# Patient Record
Sex: Male | Born: 1945 | Race: White | Hispanic: No | Marital: Married | Smoking: Never smoker
Health system: Southern US, Community
[De-identification: ages and names within clinical notes are randomized; demographics above are authoritative.]

## PROBLEM LIST (undated history)

## (undated) DIAGNOSIS — E119 Type 2 diabetes mellitus without complications: Secondary | ICD-10-CM

## (undated) DIAGNOSIS — Z8603 Personal history of neoplasm of uncertain behavior: Secondary | ICD-10-CM

## (undated) DIAGNOSIS — I872 Venous insufficiency (chronic) (peripheral): Secondary | ICD-10-CM

## (undated) DIAGNOSIS — I890 Lymphedema, not elsewhere classified: Secondary | ICD-10-CM

## (undated) HISTORY — DX: Lymphedema, not elsewhere classified: I89.0

## (undated) HISTORY — DX: Personal history of neoplasm of uncertain behavior: Z86.03

## (undated) HISTORY — DX: Venous insufficiency (chronic) (peripheral): I87.2

---

## 1999-06-14 ENCOUNTER — Encounter (HOSPITAL_BASED_OUTPATIENT_CLINIC_OR_DEPARTMENT_OTHER): Payer: Self-pay | Admitting: Internal Medicine

## 1999-06-14 ENCOUNTER — Encounter: Admission: RE | Admit: 1999-06-14 | Discharge: 1999-09-13 | Payer: Self-pay | Admitting: Internal Medicine

## 1999-09-13 ENCOUNTER — Encounter: Admission: RE | Admit: 1999-09-13 | Discharge: 1999-12-12 | Payer: Self-pay | Admitting: Internal Medicine

## 1999-11-15 ENCOUNTER — Encounter: Payer: Self-pay | Admitting: Family Medicine

## 1999-11-15 ENCOUNTER — Encounter: Admission: RE | Admit: 1999-11-15 | Discharge: 1999-11-15 | Payer: Self-pay | Admitting: Family Medicine

## 2000-03-27 ENCOUNTER — Ambulatory Visit (HOSPITAL_BASED_OUTPATIENT_CLINIC_OR_DEPARTMENT_OTHER): Admission: RE | Admit: 2000-03-27 | Discharge: 2000-03-28 | Payer: Self-pay | Admitting: Orthopedic Surgery

## 2001-07-03 ENCOUNTER — Encounter: Payer: Self-pay | Admitting: Ophthalmology

## 2001-07-03 ENCOUNTER — Ambulatory Visit (HOSPITAL_COMMUNITY): Admission: RE | Admit: 2001-07-03 | Discharge: 2001-07-04 | Payer: Self-pay | Admitting: Ophthalmology

## 2002-05-26 ENCOUNTER — Ambulatory Visit (HOSPITAL_COMMUNITY): Admission: RE | Admit: 2002-05-26 | Discharge: 2002-05-27 | Payer: Self-pay | Admitting: Ophthalmology

## 2002-09-24 ENCOUNTER — Ambulatory Visit (HOSPITAL_COMMUNITY): Admission: RE | Admit: 2002-09-24 | Discharge: 2002-09-24 | Payer: Self-pay

## 2002-10-29 ENCOUNTER — Ambulatory Visit (HOSPITAL_COMMUNITY): Admission: RE | Admit: 2002-10-29 | Discharge: 2002-10-29 | Payer: Self-pay

## 2014-10-13 ENCOUNTER — Inpatient Hospital Stay (HOSPITAL_COMMUNITY)
Admission: EM | Admit: 2014-10-13 | Discharge: 2014-10-18 | DRG: 562 | Disposition: A | Discharge status: Skilled Nursing Facility | Payer: Medicare Other | Attending: Internal Medicine | Admitting: Internal Medicine

## 2014-10-13 ENCOUNTER — Emergency Department (HOSPITAL_COMMUNITY): Payer: Medicare Other

## 2014-10-13 ENCOUNTER — Encounter (HOSPITAL_COMMUNITY): Payer: Self-pay

## 2014-10-13 DIAGNOSIS — D509 Iron deficiency anemia, unspecified: Secondary | ICD-10-CM | POA: Diagnosis present

## 2014-10-13 DIAGNOSIS — E875 Hyperkalemia: Secondary | ICD-10-CM

## 2014-10-13 DIAGNOSIS — I739 Peripheral vascular disease, unspecified: Secondary | ICD-10-CM | POA: Diagnosis present

## 2014-10-13 DIAGNOSIS — K219 Gastro-esophageal reflux disease without esophagitis: Secondary | ICD-10-CM | POA: Diagnosis present

## 2014-10-13 DIAGNOSIS — S82192A Other fracture of upper end of left tibia, initial encounter for closed fracture: Principal | ICD-10-CM | POA: Diagnosis present

## 2014-10-13 DIAGNOSIS — R339 Retention of urine, unspecified: Secondary | ICD-10-CM | POA: Diagnosis not present

## 2014-10-13 DIAGNOSIS — N179 Acute kidney failure, unspecified: Secondary | ICD-10-CM

## 2014-10-13 DIAGNOSIS — R0902 Hypoxemia: Secondary | ICD-10-CM

## 2014-10-13 DIAGNOSIS — E1122 Type 2 diabetes mellitus with diabetic chronic kidney disease: Secondary | ICD-10-CM

## 2014-10-13 DIAGNOSIS — N289 Disorder of kidney and ureter, unspecified: Secondary | ICD-10-CM

## 2014-10-13 DIAGNOSIS — Z7901 Long term (current) use of anticoagulants: Secondary | ICD-10-CM

## 2014-10-13 DIAGNOSIS — D638 Anemia in other chronic diseases classified elsewhere: Secondary | ICD-10-CM | POA: Diagnosis present

## 2014-10-13 DIAGNOSIS — E785 Hyperlipidemia, unspecified: Secondary | ICD-10-CM | POA: Diagnosis present

## 2014-10-13 DIAGNOSIS — Z6841 Body Mass Index (BMI) 40.0 and over, adult: Secondary | ICD-10-CM

## 2014-10-13 DIAGNOSIS — J9601 Acute respiratory failure with hypoxia: Secondary | ICD-10-CM | POA: Diagnosis present

## 2014-10-13 DIAGNOSIS — M8580 Other specified disorders of bone density and structure, unspecified site: Secondary | ICD-10-CM | POA: Diagnosis present

## 2014-10-13 DIAGNOSIS — N170 Acute kidney failure with tubular necrosis: Secondary | ICD-10-CM | POA: Diagnosis present

## 2014-10-13 DIAGNOSIS — E872 Acidosis: Secondary | ICD-10-CM | POA: Diagnosis present

## 2014-10-13 DIAGNOSIS — Z8673 Personal history of transient ischemic attack (TIA), and cerebral infarction without residual deficits: Secondary | ICD-10-CM

## 2014-10-13 DIAGNOSIS — I872 Venous insufficiency (chronic) (peripheral): Secondary | ICD-10-CM | POA: Diagnosis present

## 2014-10-13 DIAGNOSIS — I129 Hypertensive chronic kidney disease with stage 1 through stage 4 chronic kidney disease, or unspecified chronic kidney disease: Secondary | ICD-10-CM | POA: Diagnosis present

## 2014-10-13 DIAGNOSIS — I890 Lymphedema, not elsewhere classified: Secondary | ICD-10-CM | POA: Diagnosis present

## 2014-10-13 DIAGNOSIS — M25562 Pain in left knee: Secondary | ICD-10-CM

## 2014-10-13 DIAGNOSIS — W109XXA Fall (on) (from) unspecified stairs and steps, initial encounter: Secondary | ICD-10-CM | POA: Diagnosis present

## 2014-10-13 DIAGNOSIS — G4733 Obstructive sleep apnea (adult) (pediatric): Secondary | ICD-10-CM | POA: Diagnosis present

## 2014-10-13 DIAGNOSIS — E1165 Type 2 diabetes mellitus with hyperglycemia: Secondary | ICD-10-CM | POA: Diagnosis present

## 2014-10-13 DIAGNOSIS — R52 Pain, unspecified: Secondary | ICD-10-CM

## 2014-10-13 DIAGNOSIS — M25462 Effusion, left knee: Secondary | ICD-10-CM | POA: Diagnosis present

## 2014-10-13 DIAGNOSIS — E11649 Type 2 diabetes mellitus with hypoglycemia without coma: Secondary | ICD-10-CM | POA: Diagnosis not present

## 2014-10-13 DIAGNOSIS — N183 Chronic kidney disease, stage 3 (moderate): Secondary | ICD-10-CM | POA: Diagnosis present

## 2014-10-13 HISTORY — DX: Type 2 diabetes mellitus without complications: E11.9

## 2014-10-13 HISTORY — DX: Morbid (severe) obesity due to excess calories: E66.01

## 2014-10-13 LAB — CBG MONITORING, ED: Glucose-Capillary: 103 mg/dL — ABNORMAL HIGH (ref 65–99)

## 2014-10-13 NOTE — ED Notes (Signed)
Note:  He is unable to recall his medical history/meds and wishes to wait until his wife arrives, whom he states will be able to give [place] this information.

## 2014-10-13 NOTE — ED Notes (Signed)
Pt to xray

## 2014-10-13 NOTE — ED Notes (Signed)
While sleeping pt's O2 sat dropped to 88%. He was placed on 2L of Oxygen while sleeping for this

## 2014-10-13 NOTE — ED Provider Notes (Signed)
CSN: [id number]     Arrival date & time 10/13/14  1833 History   First MD Initiated Contact with Patient 10/13/14 2231     Chief Complaint  Patient presents with  . Fall   HPI  69 year old morbidly obese male presents today with left knee pain. Patient reports he was going up steps yesterday when he had his right knee flexed, reports pain to the right knee with a popping sound in his knee. Patient denies any fall, reports EMS came to the house and helped him into his bed. He notes since the incident he has been unable to ambulate due to left knee pain. Patient denies loss of sensation or perfusion to the distal extremity. Patient reports he takes oral tramadol as needed for back pain, reports this is only minorly improved his symptoms.  Past Medical History  Diagnosis Date  . Morbid obesity    No past surgical history on file. No family history on file. Social History  Substance Use Topics  . Smoking status: Never Smoker   . Smokeless tobacco: None  . Alcohol Use: No    Review of Systems  All other systems reviewed and are negative.   Allergies  Codeine and Shellfish allergy  Home Medications   Prior to Admission medications   Medication Sig Start Date End Date Taking? Authorizing Provider  allopurinol (ZYLOPRIM) 300 MG tablet Take 300 mg by mouth daily.   Yes Historical Provider, MD  amLODipine (NORVASC) 10 MG tablet Take 10 mg by mouth daily.   Yes Historical Provider, MD  atorvastatin (LIPITOR) 20 MG tablet Take 20 mg by mouth at bedtime.   Yes Historical Provider, MD  cloNIDine (CATAPRES) 0.2 MG tablet Take 0.2 mg by mouth 3 (three) times daily.   Yes Historical Provider, MD  diphenhydrAMINE (BENADRYL) 25 MG tablet Take 25 mg by mouth every 6 (six) hours as needed for itching.   Yes Historical Provider, MD  docusate sodium (COLACE) 100 MG capsule Take 100 mg by mouth 2 (two) times daily.   Yes Historical Provider, MD  ferrous sulfate 325 (65 FE) MG tablet Take 325 mg by  mouth daily with breakfast.   Yes Historical Provider, MD  gabapentin (NEURONTIN) 100 MG capsule Take 100 mg by mouth 2 (two) times daily.   Yes Historical Provider, MD  hydrOXYzine (ATARAX/VISTARIL) 25 MG tablet Take 25 mg by mouth 2 (two) times daily.   Yes Historical Provider, MD  insulin NPH Human (HUMULIN N,NOVOLIN N) 100 UNIT/ML injection Inject 60-70 Units into the skin 2 (two) times daily before a meal. Take 70 units every morning and Take 60 units every evening.   Yes Historical Provider, MD  insulin regular (NOVOLIN R,HUMULIN R) 100 units/mL injection Inject 3 Units into the skin 3 (three) times daily as needed for high blood sugar.   Yes Historical Provider, MD  levocetirizine (XYZAL) 5 MG tablet Take 5 mg by mouth every evening.   Yes Historical Provider, MD  lisinopril (PRINIVIL,ZESTRIL) 40 MG tablet Take 40 mg by mouth 2 (two) times daily.   Yes Historical Provider, MD  Nystatin (NYAMYC) 100000 UNIT/GM POWD Apply 1 g topically 2 (two) times daily.  10/05/14  Yes Historical Provider, MD  omeprazole (PRILOSEC) 20 MG capsule Take 20 mg by mouth daily.   Yes Historical Provider, MD  pramipexole (MIRAPEX) 1 MG tablet Take 1 mg by mouth at bedtime.   Yes Historical Provider, MD  Probiotic CAPS Take 1 capsule by mouth daily.   Yes  Historical Provider, MD  SSD 1 % cream Apply 1 application topically daily as needed (sores).  09/20/14  Yes Historical Provider, MD  tamsulosin (FLOMAX) 0.4 MG CAPS capsule Take 0.4 mg by mouth daily.   Yes Historical Provider, MD  tiZANidine (ZANAFLEX) 2 MG tablet Take 2 mg by mouth 2 (two) times daily.   Yes Historical Provider, MD  traMADol (ULTRAM) 50 MG tablet Take 50 mg by mouth every 6 (six) hours as needed for moderate pain or severe pain.   Yes Historical Provider, MD  Triamcinolone Acetonide (TRIAMCINOLONE 0.1 % CREAM : EUCERIN) CREA Apply 1 application topically daily.   Yes Historical Provider, MD  warfarin (COUMADIN) 10 MG tablet Take 5 mg by mouth daily.    Yes Historical Provider, MD   BP 130/62 mmHg  Pulse 68  Temp(Src) 98.1 F (36.7 C) (Oral)  Resp 24  Ht 5\' 5"  (1.651 m)  Wt 415 lb (188.243 kg)  BMI 69.06 kg/m2  SpO2 92%   Physical Exam  Constitutional: He is oriented to person, place, and time. He appears well-developed and well-nourished.  Morbidly obese  HENT:  Head: Normocephalic and atraumatic.  Eyes: Conjunctivae are normal. Pupils are equal, round, and reactive to light. Right eye exhibits no discharge. Left eye exhibits no discharge. No scleral icterus.  Neck: Normal range of motion. No JVD present. No tracheal deviation present.  Pulmonary/Chest: Effort normal. No stridor.  Musculoskeletal:  TTP of right knee and hip. Difficult exam due to body habitus. Pain with flexion. Distal extremities wrapped due to cellultis  Neurological: He is alert and oriented to person, place, and time. Coordination normal.  Psychiatric: He has a normal mood and affect. His behavior is normal. Judgment and thought content normal.  Nursing note and vitals reviewed.   ED Course  Procedures (including critical care time) Labs Review Labs Reviewed  CBG MONITORING, ED - Abnormal; Notable for the following:    Glucose-Capillary 103 (*)    All other components within normal limits    Imaging Review No results found. I have personally reviewed and evaluated these images and lab results as part of my medical decision-making.   EKG Interpretation None      MDM   Final diagnoses:  Pain  Renal insufficiency  Hyperkalemia  Knee pain, acute, left    Labs: CBC, BMP  Imaging: DG knee, Hip, femur, pending  Consults:  Therapeutics:  Discharge Meds:   Assessment/Plan: Patient presents with left knee pain, initial DG knee films showed no joint effusion, potential fracture. Patient was signed off to mid level Junius Creamer NP at time of shift change pending remaining films, labs. Patient was in no acute distress at the time of my  evaluation.        Okey Regal, PA-C 10/14/14 1423  Rolland Porter, MD 11/02/14 [phone]

## 2014-10-13 NOTE — ED Notes (Signed)
Bed: BM18 Expected date:  Expected time:  Means of arrival:  Comments: Ems- bariatric knee pain

## 2014-10-13 NOTE — Progress Notes (Addendum)
EDCM spoke to patient's wife at bedside, patient asleep.  Patient's wife is Doctor, hospital.  Patient lives with his wife at home.  Patient's wife confirms patient's pcp is Dr. Ella Jubilee.  Patient's wife confirms patient is usually able to perform his own ADL's at home.  Patient's wife reports patient's knee is sore and swollen now, and unable to put weight on it.  Patient's wife reports patient has a power chair at home, bedside commode, a hospital bed and 2 walkers.  Offered support to patient's wife.  EDCM provided patient's wife list of home health services in [facility], explained services. Patient's wife reports patient is already receiving home health services with [facility] for PT and OT. Patient's wife thankful for resources.  No further EDCM needs at this time.

## 2014-10-13 NOTE — Progress Notes (Signed)
CSW met with patient at bedside. Patient appeared to be sleepy. There was no family present.   Per chart, patient presents to [facility] due to bariatric knee pain. Patient states that while going up the steps he was not able to pull his R leg up. Patient informed CSW that EMS came to the scene and attempted to straighten his leg out. Patient states that his leg popped x3.  Patient informed CSW that he lives in [place] with is wife. Patient states that his wife is his primary support and she assist him with completing his ADL's. Patient denies falling often.  Patient states that he does not have any questions for CSW at this time.   Willette Brace [phone] ED CSW 10/13/2014 8:25 PM

## 2014-10-13 NOTE — ED Notes (Signed)
He states that yesterday his left knee "gave out" and is still sore today.  He is in no distress.  He is morbidly obese.  He denies any other injury or discomfort.

## 2014-10-14 ENCOUNTER — Inpatient Hospital Stay (HOSPITAL_COMMUNITY): Payer: Medicare Other

## 2014-10-14 ENCOUNTER — Emergency Department (HOSPITAL_COMMUNITY): Payer: Medicare Other

## 2014-10-14 ENCOUNTER — Encounter (HOSPITAL_COMMUNITY): Payer: Self-pay

## 2014-10-14 DIAGNOSIS — R339 Retention of urine, unspecified: Secondary | ICD-10-CM | POA: Diagnosis not present

## 2014-10-14 DIAGNOSIS — M25569 Pain in unspecified knee: Secondary | ICD-10-CM | POA: Insufficient documentation

## 2014-10-14 DIAGNOSIS — M25562 Pain in left knee: Secondary | ICD-10-CM | POA: Diagnosis present

## 2014-10-14 DIAGNOSIS — Z8673 Personal history of transient ischemic attack (TIA), and cerebral infarction without residual deficits: Secondary | ICD-10-CM | POA: Diagnosis not present

## 2014-10-14 DIAGNOSIS — E11649 Type 2 diabetes mellitus with hypoglycemia without coma: Secondary | ICD-10-CM | POA: Diagnosis not present

## 2014-10-14 DIAGNOSIS — I129 Hypertensive chronic kidney disease with stage 1 through stage 4 chronic kidney disease, or unspecified chronic kidney disease: Secondary | ICD-10-CM | POA: Diagnosis present

## 2014-10-14 DIAGNOSIS — Z6841 Body Mass Index (BMI) 40.0 and over, adult: Secondary | ICD-10-CM | POA: Diagnosis not present

## 2014-10-14 DIAGNOSIS — N170 Acute kidney failure with tubular necrosis: Secondary | ICD-10-CM | POA: Diagnosis present

## 2014-10-14 DIAGNOSIS — S82192A Other fracture of upper end of left tibia, initial encounter for closed fracture: Secondary | ICD-10-CM | POA: Diagnosis present

## 2014-10-14 DIAGNOSIS — E875 Hyperkalemia: Secondary | ICD-10-CM | POA: Insufficient documentation

## 2014-10-14 DIAGNOSIS — M8580 Other specified disorders of bone density and structure, unspecified site: Secondary | ICD-10-CM | POA: Diagnosis present

## 2014-10-14 DIAGNOSIS — E1122 Type 2 diabetes mellitus with diabetic chronic kidney disease: Secondary | ICD-10-CM | POA: Diagnosis present

## 2014-10-14 DIAGNOSIS — E785 Hyperlipidemia, unspecified: Secondary | ICD-10-CM | POA: Diagnosis present

## 2014-10-14 DIAGNOSIS — I872 Venous insufficiency (chronic) (peripheral): Secondary | ICD-10-CM | POA: Diagnosis present

## 2014-10-14 DIAGNOSIS — K219 Gastro-esophageal reflux disease without esophagitis: Secondary | ICD-10-CM | POA: Diagnosis present

## 2014-10-14 DIAGNOSIS — N179 Acute kidney failure, unspecified: Secondary | ICD-10-CM | POA: Diagnosis not present

## 2014-10-14 DIAGNOSIS — D638 Anemia in other chronic diseases classified elsewhere: Secondary | ICD-10-CM | POA: Diagnosis present

## 2014-10-14 DIAGNOSIS — G4733 Obstructive sleep apnea (adult) (pediatric): Secondary | ICD-10-CM | POA: Diagnosis not present

## 2014-10-14 DIAGNOSIS — S82202D Unspecified fracture of shaft of left tibia, subsequent encounter for closed fracture with routine healing: Secondary | ICD-10-CM | POA: Diagnosis not present

## 2014-10-14 DIAGNOSIS — I10 Essential (primary) hypertension: Secondary | ICD-10-CM | POA: Diagnosis not present

## 2014-10-14 DIAGNOSIS — N183 Chronic kidney disease, stage 3 (moderate): Secondary | ICD-10-CM | POA: Diagnosis not present

## 2014-10-14 DIAGNOSIS — N289 Disorder of kidney and ureter, unspecified: Secondary | ICD-10-CM

## 2014-10-14 DIAGNOSIS — J9601 Acute respiratory failure with hypoxia: Secondary | ICD-10-CM | POA: Diagnosis not present

## 2014-10-14 DIAGNOSIS — I890 Lymphedema, not elsewhere classified: Secondary | ICD-10-CM | POA: Diagnosis present

## 2014-10-14 DIAGNOSIS — M25462 Effusion, left knee: Secondary | ICD-10-CM | POA: Diagnosis present

## 2014-10-14 DIAGNOSIS — E1165 Type 2 diabetes mellitus with hyperglycemia: Secondary | ICD-10-CM | POA: Diagnosis present

## 2014-10-14 DIAGNOSIS — E872 Acidosis: Secondary | ICD-10-CM | POA: Diagnosis present

## 2014-10-14 DIAGNOSIS — I739 Peripheral vascular disease, unspecified: Secondary | ICD-10-CM | POA: Diagnosis present

## 2014-10-14 DIAGNOSIS — Z7901 Long term (current) use of anticoagulants: Secondary | ICD-10-CM | POA: Diagnosis not present

## 2014-10-14 DIAGNOSIS — D509 Iron deficiency anemia, unspecified: Secondary | ICD-10-CM | POA: Diagnosis present

## 2014-10-14 DIAGNOSIS — W109XXA Fall (on) (from) unspecified stairs and steps, initial encounter: Secondary | ICD-10-CM | POA: Diagnosis present

## 2014-10-14 LAB — BASIC METABOLIC PANEL
Anion gap: 8 (ref 5–15)
Anion gap: 8 (ref 5–15)
BUN: 45 mg/dL — ABNORMAL HIGH (ref 6–20)
BUN: 40 mg/dL — ABNORMAL HIGH (ref 6–20)
CHLORIDE: 114 mmol/L — AB (ref 101–111)
CO2: 18 mmol/L — ABNORMAL LOW (ref 22–32)
CO2: 19 mmol/L — ABNORMAL LOW (ref 22–32)
Calcium: 8.4 mg/dL — ABNORMAL LOW (ref 8.9–10.3)
Calcium: 8.4 mg/dL — ABNORMAL LOW (ref 8.9–10.3)
Chloride: 113 mmol/L — ABNORMAL HIGH (ref 101–111)
Creatinine, Ser: 3.06 mg/dL — ABNORMAL HIGH (ref 0.61–1.24)
Creatinine, Ser: 2.89 mg/dL — ABNORMAL HIGH (ref 0.61–1.24)
GFR calc Af Amer: 22 mL/min — ABNORMAL LOW (ref >60)
GFR calc non Af Amer: 19 mL/min — ABNORMAL LOW (ref >60)
GFR calc non Af Amer: 20 mL/min — ABNORMAL LOW (ref >60)
GFR, EST AFRICAN AMERICAN: 24 mL/min — AB (ref >60)
Glucose, Bld: 92 mg/dL (ref 65–99)
Glucose, Bld: 135 mg/dL — ABNORMAL HIGH (ref 65–99)
POTASSIUM: 6 mmol/L — AB (ref 3.5–5.1)
Potassium: 5.9 mmol/L — ABNORMAL HIGH (ref 3.5–5.1)
SODIUM: 141 mmol/L (ref 135–145)
Sodium: 139 mmol/L (ref 135–145)

## 2014-10-14 LAB — CBC WITH DIFFERENTIAL/PLATELET
Basophils Absolute: 0 K/uL (ref 0.0–0.1)
Basophils Relative: 0 % (ref 0–1)
Eosinophils Absolute: 0.3 K/uL (ref 0.0–0.7)
Eosinophils Relative: 5 % (ref 0–5)
HCT: 29.7 % — ABNORMAL LOW (ref 39.0–52.0)
Hemoglobin: 9.2 g/dL — ABNORMAL LOW (ref 13.0–17.0)
Lymphocytes Relative: 17 % (ref 12–46)
Lymphs Abs: 1.1 K/uL (ref 0.7–4.0)
MCH: 27.1 pg (ref 26.0–34.0)
MCHC: 31 g/dL (ref 30.0–36.0)
MCV: 87.6 fL (ref 78.0–100.0)
Monocytes Absolute: 0.6 K/uL (ref 0.1–1.0)
Monocytes Relative: 9 % (ref 3–12)
Neutro Abs: 4.4 K/uL (ref 1.7–7.7)
Neutrophils Relative %: 69 % (ref 43–77)
Platelets: 181 K/uL (ref 150–400)
RBC: 3.39 MIL/uL — ABNORMAL LOW (ref 4.22–5.81)
RDW: 16.5 % — ABNORMAL HIGH (ref 11.5–15.5)
WBC: 6.4 K/uL (ref 4.0–10.5)

## 2014-10-14 LAB — URINALYSIS W MICROSCOPIC (NOT AT ARMC)
Bilirubin Urine: NEGATIVE
Glucose, UA: NEGATIVE mg/dL
Ketones, ur: NEGATIVE mg/dL
Leukocytes, UA: NEGATIVE
Nitrite: NEGATIVE
Protein, ur: 100 mg/dL — AB
Specific Gravity, Urine: 1.014 (ref 1.005–1.030)
Urobilinogen, UA: 0.2 mg/dL (ref 0.0–1.0)
pH: 5.5 (ref 5.0–8.0)

## 2014-10-14 LAB — I-STAT CHEM 8, ED
BUN: 40 mg/dL — AB (ref 6–20)
CREATININE: 2.9 mg/dL — AB (ref 0.61–1.24)
Calcium, Ion: 1.05 mmol/L — ABNORMAL LOW (ref 1.13–1.30)
Chloride: 114 mmol/L — ABNORMAL HIGH (ref 101–111)
GLUCOSE: 91 mg/dL (ref 65–99)
HCT: 28 % — ABNORMAL LOW (ref 39.0–52.0)
HEMOGLOBIN: 9.5 g/dL — AB (ref 13.0–17.0)
POTASSIUM: 6.1 mmol/L — AB (ref 3.5–5.1)
Sodium: 140 mmol/L (ref 135–145)
TCO2: 17 mmol/L (ref 0–100)

## 2014-10-14 LAB — SODIUM, URINE, RANDOM: SODIUM UR: 133 mmol/L

## 2014-10-14 LAB — PROTIME-INR
INR: 2.29 — ABNORMAL HIGH (ref 0.00–1.49)
Prothrombin Time: 25 seconds — ABNORMAL HIGH (ref 11.6–15.2)

## 2014-10-14 LAB — GLUCOSE, CAPILLARY
GLUCOSE-CAPILLARY: 144 mg/dL — AB (ref 65–99)
GLUCOSE-CAPILLARY: 115 mg/dL — AB (ref 65–99)
GLUCOSE-CAPILLARY: 162 mg/dL — AB (ref 65–99)
Glucose-Capillary: 89 mg/dL (ref 65–99)

## 2014-10-14 LAB — TSH: TSH: 3.743 u[IU]/mL (ref 0.350–4.500)

## 2014-10-14 LAB — CREATININE, URINE, RANDOM: Creatinine, Urine: 43.16 mg/dL

## 2014-10-14 MED ORDER — HYDROXYZINE HCL 25 MG PO TABS
25.0000 mg | ORAL_TABLET | Freq: Two times a day (BID) | ORAL | Status: DC
  Administered 2014-10-14 – 2014-10-18 (×9): 25 mg via ORAL
  Filled 2014-10-14 (×9): qty 1

## 2014-10-14 MED ORDER — INSULIN NPH (HUMAN) (ISOPHANE) 100 UNIT/ML ~~LOC~~ SUSP
70.0000 [IU] | Freq: Every day | SUBCUTANEOUS | Status: DC
  Administered 2014-10-14 – 2014-10-15 (×2): 70 [IU] via SUBCUTANEOUS
  Filled 2014-10-14: qty 10

## 2014-10-14 MED ORDER — WARFARIN - PHARMACIST DOSING INPATIENT
Freq: Every day | Status: DC
  Administered 2014-10-18: 18:00:00

## 2014-10-14 MED ORDER — TAMSULOSIN HCL 0.4 MG PO CAPS
0.4000 mg | ORAL_CAPSULE | Freq: Every day | ORAL | Status: DC
  Administered 2014-10-14 – 2014-10-18 (×5): 0.4 mg via ORAL
  Filled 2014-10-14 (×5): qty 1

## 2014-10-14 MED ORDER — INSULIN ASPART 100 UNIT/ML ~~LOC~~ SOLN
0.0000 [IU] | Freq: Three times a day (TID) | SUBCUTANEOUS | Status: DC
  Administered 2014-10-15: 2 [IU] via SUBCUTANEOUS
  Administered 2014-10-15 – 2014-10-18 (×5): 1 [IU] via SUBCUTANEOUS

## 2014-10-14 MED ORDER — LEVOCETIRIZINE DIHYDROCHLORIDE 5 MG PO TABS: 5.0000 mg | ORAL_TABLET | Freq: Every evening | ORAL | Status: DC

## 2014-10-14 MED ORDER — TRAMADOL HCL 50 MG PO TABS
50.0000 mg | ORAL_TABLET | Freq: Four times a day (QID) | ORAL | Status: DC | PRN
  Administered 2014-10-14 (×2): 50 mg via ORAL
  Filled 2014-10-14 (×2): qty 1

## 2014-10-14 MED ORDER — WARFARIN SODIUM 5 MG PO TABS: 5.0000 mg | ORAL_TABLET | Freq: Every day | ORAL | Status: DC

## 2014-10-14 MED ORDER — ATORVASTATIN CALCIUM 10 MG PO TABS
20.0000 mg | ORAL_TABLET | Freq: Every day | ORAL | Status: DC
  Administered 2014-10-14 – 2014-10-17 (×4): 20 mg via ORAL
  Filled 2014-10-14 (×4): qty 2

## 2014-10-14 MED ORDER — SODIUM CHLORIDE 0.9 % IJ SOLN
3.0000 mL | Freq: Two times a day (BID) | INTRAMUSCULAR | Status: DC
  Administered 2014-10-16 – 2014-10-18 (×4): 3 mL via INTRAVENOUS

## 2014-10-14 MED ORDER — ALLOPURINOL 300 MG PO TABS
300.0000 mg | ORAL_TABLET | Freq: Every day | ORAL | Status: DC
  Administered 2014-10-14 – 2014-10-18 (×5): 300 mg via ORAL
  Filled 2014-10-14 (×5): qty 1

## 2014-10-14 MED ORDER — LORATADINE 10 MG PO TABS
10.0000 mg | ORAL_TABLET | Freq: Every day | ORAL | Status: DC
  Administered 2014-10-14 – 2014-10-18 (×5): 10 mg via ORAL
  Filled 2014-10-14 (×5): qty 1

## 2014-10-14 MED ORDER — DIPHENHYDRAMINE HCL 25 MG PO CAPS
25.0000 mg | ORAL_CAPSULE | Freq: Four times a day (QID) | ORAL | Status: DC | PRN
  Administered 2014-10-17 – 2014-10-18 (×3): 25 mg via ORAL
  Filled 2014-10-14 (×3): qty 1

## 2014-10-14 MED ORDER — SODIUM CHLORIDE 0.9 % IV BOLUS (SEPSIS)
500.0000 mL | Freq: Once | INTRAVENOUS | Status: AC
  Administered 2014-10-14: 500 mL via INTRAVENOUS

## 2014-10-14 MED ORDER — ONDANSETRON HCL 4 MG/2ML IJ SOLN: 4.0000 mg | Freq: Four times a day (QID) | INTRAMUSCULAR | Status: DC | PRN

## 2014-10-14 MED ORDER — INSULIN NPH (HUMAN) (ISOPHANE) 100 UNIT/ML ~~LOC~~ SUSP
60.0000 [IU] | Freq: Every day | SUBCUTANEOUS | Status: DC
  Administered 2014-10-14 – 2014-10-15 (×2): 60 [IU] via SUBCUTANEOUS

## 2014-10-14 MED ORDER — FERROUS SULFATE 325 (65 FE) MG PO TABS
325.0000 mg | ORAL_TABLET | Freq: Every day | ORAL | Status: DC
  Administered 2014-10-14 – 2014-10-18 (×5): 325 mg via ORAL
  Filled 2014-10-14 (×5): qty 1

## 2014-10-14 MED ORDER — WARFARIN SODIUM 5 MG PO TABS
5.0000 mg | ORAL_TABLET | Freq: Once | ORAL | Status: AC
  Administered 2014-10-14: 5 mg via ORAL
  Filled 2014-10-14: qty 1

## 2014-10-14 MED ORDER — NYSTATIN 100000 UNIT/GM EX POWD
Freq: Two times a day (BID) | CUTANEOUS | Status: DC
  Administered 2014-10-14 – 2014-10-18 (×9): via TOPICAL
  Filled 2014-10-14: qty 15

## 2014-10-14 MED ORDER — GABAPENTIN 100 MG PO CAPS
100.0000 mg | ORAL_CAPSULE | Freq: Two times a day (BID) | ORAL | Status: DC
  Administered 2014-10-14 – 2014-10-18 (×9): 100 mg via ORAL
  Filled 2014-10-14 (×9): qty 1

## 2014-10-14 MED ORDER — TRAMADOL HCL 50 MG PO TABS
50.0000 mg | ORAL_TABLET | Freq: Four times a day (QID) | ORAL | Status: DC | PRN
  Administered 2014-10-15: 50 mg via ORAL
  Filled 2014-10-14: qty 1

## 2014-10-14 MED ORDER — PRAMIPEXOLE DIHYDROCHLORIDE 1 MG PO TABS
1.0000 mg | ORAL_TABLET | Freq: Every day | ORAL | Status: DC
  Administered 2014-10-15 – 2014-10-17 (×4): 1 mg via ORAL
  Filled 2014-10-14 (×5): qty 1

## 2014-10-14 MED ORDER — AMLODIPINE BESYLATE 10 MG PO TABS
10.0000 mg | ORAL_TABLET | Freq: Every day | ORAL | Status: DC
  Administered 2014-10-14 – 2014-10-18 (×5): 10 mg via ORAL
  Filled 2014-10-14 (×5): qty 1

## 2014-10-14 MED ORDER — OXYCODONE-ACETAMINOPHEN 5-325 MG PO TABS
1.0000 | ORAL_TABLET | ORAL | Status: DC | PRN
  Administered 2014-10-15 (×2): 2 via ORAL
  Administered 2014-10-15: 1 via ORAL
  Filled 2014-10-14: qty 2
  Filled 2014-10-14: qty 1
  Filled 2014-10-14: qty 2

## 2014-10-14 MED ORDER — CLONIDINE HCL 0.1 MG PO TABS
0.2000 mg | ORAL_TABLET | Freq: Three times a day (TID) | ORAL | Status: DC
  Administered 2014-10-14 – 2014-10-18 (×14): 0.2 mg via ORAL
  Filled 2014-10-14 (×14): qty 2

## 2014-10-14 MED ORDER — OXYCODONE-ACETAMINOPHEN 5-325 MG PO TABS
2.0000 | ORAL_TABLET | Freq: Once | ORAL | Status: AC
  Administered 2014-10-14: 2 via ORAL
  Filled 2014-10-14: qty 2

## 2014-10-14 MED ORDER — PANTOPRAZOLE SODIUM 40 MG PO TBEC
40.0000 mg | DELAYED_RELEASE_TABLET | Freq: Every day | ORAL | Status: DC
  Administered 2014-10-14 – 2014-10-18 (×5): 40 mg via ORAL
  Filled 2014-10-14 (×5): qty 1

## 2014-10-14 MED ORDER — NYAMYC 100000 UNIT/GM EX POWD: 1.0000 g | Freq: Two times a day (BID) | CUTANEOUS | Status: DC

## 2014-10-14 MED ORDER — SODIUM CHLORIDE 0.9 % IV SOLN
Freq: Once | INTRAVENOUS | Status: AC
  Administered 2014-10-14: 05:00:00 via INTRAVENOUS

## 2014-10-14 MED ORDER — SODIUM POLYSTYRENE SULFONATE 15 GM/60ML PO SUSP
30.0000 g | Freq: Once | ORAL | Status: AC
  Administered 2014-10-14: 30 g via ORAL
  Filled 2014-10-14: qty 120

## 2014-10-14 MED ORDER — ONDANSETRON HCL 4 MG PO TABS: 4.0000 mg | ORAL_TABLET | Freq: Four times a day (QID) | ORAL | Status: DC | PRN

## 2014-10-14 MED ORDER — TIZANIDINE HCL 2 MG PO TABS
2.0000 mg | ORAL_TABLET | Freq: Two times a day (BID) | ORAL | Status: DC
  Administered 2014-10-14 – 2014-10-18 (×9): 2 mg via ORAL
  Filled 2014-10-14 (×11): qty 1

## 2014-10-14 MED ORDER — DOCUSATE SODIUM 100 MG PO CAPS
100.0000 mg | ORAL_CAPSULE | Freq: Two times a day (BID) | ORAL | Status: DC
  Administered 2014-10-14 – 2014-10-18 (×9): 100 mg via ORAL
  Filled 2014-10-14 (×10): qty 1

## 2014-10-14 MED ORDER — TRIAMCINOLONE 0.1 % CREAM:EUCERIN CREAM 1:1
1.0000 "application " | TOPICAL_CREAM | Freq: Every day | CUTANEOUS | Status: DC
  Administered 2014-10-14 – 2014-10-18 (×4): 1 via TOPICAL
  Filled 2014-10-14: qty 1

## 2014-10-14 MED ORDER — INSULIN ASPART 100 UNIT/ML ~~LOC~~ SOLN
0.0000 [IU] | SUBCUTANEOUS | Status: DC
  Administered 2014-10-14: 1 [IU] via SUBCUTANEOUS

## 2014-10-14 NOTE — ED Notes (Signed)
Patient transported to CT 

## 2014-10-14 NOTE — ED Provider Notes (Signed)
Pt was going up stairs today, and he had his left leg on the landing and his right leg on the step up, however, his right leg wouldn't support his weight to step up and he twisted his left knee. He has been unable to bear weight on his left leg since that time.   Pt is morbidly obese, with a large panus. He has some tenderness in the right hip area to palpation. His knee doesn't appear to have any deformity.    Medical screening examination/treatment/procedure(s) were conducted as a shared visit with non-physician practitioner(s) and myself.  I personally evaluated the patient during the encounter.   EKG Interpretation None       Rolland Porter, MD, Barbette Or, MD 10/14/14 [phone]

## 2014-10-14 NOTE — Progress Notes (Signed)
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 Codeine Itching  . Shellfish Allergy Nausea Only    Patient Measurements: Height: 5\' 5"  (165.1 cm) Weight: (!) 423 lb 15.1 oz (192.3 kg) IBW/kg (Calculated) : 61.5  Vital Signs: Temp: 98.3 F (36.8 C) (08/25 1311) Temp Source: Oral (08/25 1311) BP: 150/65 mmHg (08/25 1311) Pulse Rate: 87 (08/25 1311)  Labs:  Recent Labs  10/14/14 0237 10/14/14 0646 10/14/14 1210  HGB 9.2* 9.5*  --   HCT 29.7* 28.0*  --   PLT 181  --   --   LABPROT  --   --  25.0*  INR  --   --  2.29*  CREATININE 3.06* 2.90*  --     Estimated Creatinine Clearance: 37.6 mL/min (by C-G formula based on Cr of 2.9).   Medical History: Past Medical History  Diagnosis Date  . Morbid obesity   . Diabetes mellitus without complication     Medications:  Prescriptions prior to admission  Medication Sig Dispense Refill Last Dose  . allopurinol (ZYLOPRIM) 300 MG tablet Take 300 mg by mouth daily.   10/13/2014 at Unknown time  . amLODipine (NORVASC) 10 MG tablet Take 10 mg by mouth daily.   10/13/2014 at Unknown time  . atorvastatin (LIPITOR) 20 MG tablet Take 20 mg by mouth at bedtime.   10/12/2014 at Unknown time  . cloNIDine (CATAPRES) 0.2 MG tablet Take 0.2 mg by mouth 3 (three) times daily.   10/13/2014 at 1400  . diphenhydrAMINE (BENADRYL) 25 MG tablet Take 25 mg by mouth every 6 (six) hours as needed for itching.   10/12/2014 at Unknown time  . docusate sodium (COLACE) 100 MG capsule Take 100 mg by mouth 2 (two) times daily.   10/13/2014 at Unknown time  . ferrous sulfate 325 (65 FE) MG tablet Take 325 mg by mouth daily with breakfast.   10/13/2014 at Unknown time  . gabapentin (NEURONTIN) 100 MG capsule Take 100 mg by mouth 2 (two) times daily.   10/13/2014 at Unknown time  . hydrOXYzine (ATARAX/VISTARIL) 25 MG tablet Take 25 mg by mouth 2 (two) times daily.    10/13/2014 at Unknown time  . insulin NPH Human (HUMULIN N,NOVOLIN N) 100 UNIT/ML injection Inject 60-70 Units into the skin 2 (two) times daily before a meal. Take 70 units every morning and Take 60 units every evening.   10/13/2014 at Unknown time  . insulin regular (NOVOLIN R,HUMULIN R) 100 units/mL injection Inject 3 Units into the skin 3 (three) times daily as needed for high blood sugar.   10/13/2014 at Unknown time  . levocetirizine (XYZAL) 5 MG tablet Take 5 mg by mouth every evening.   10/12/2014 at Unknown time  . lisinopril (PRINIVIL,ZESTRIL) 40 MG tablet Take 40 mg by mouth 2 (two) times daily.   10/13/2014 at Unknown time  . Nystatin (NYAMYC) 100000 UNIT/GM POWD Apply 1 g topically 2 (two) times daily.   4 10/13/2014 at Unknown time  . omeprazole (PRILOSEC) 20 MG capsule Take 20 mg by mouth daily.   10/13/2014 at Unknown time  . pramipexole (MIRAPEX) 1 MG tablet Take 1 mg by mouth at bedtime.   10/12/2014 at Unknown time  . Probiotic CAPS Take 1 capsule by mouth daily.   10/13/2014 at Unknown time  . SSD 1 % cream Apply 1 application topically daily as needed (sores).   0 Past Month at  Unknown time  . tamsulosin (FLOMAX) 0.4 MG CAPS capsule Take 0.4 mg by mouth daily.   10/13/2014 at Unknown time  . tiZANidine (ZANAFLEX) 2 MG tablet Take 2 mg by mouth 2 (two) times daily.   10/13/2014 at Unknown time  . traMADol (ULTRAM) 50 MG tablet Take 50 mg by mouth every 6 (six) hours as needed for moderate pain or severe pain.   10/12/2014 at Unknown time  . Triamcinolone Acetonide (TRIAMCINOLONE 0.1 % CREAM : EUCERIN) CREA Apply 1 application topically daily.   10/13/2014 at Unknown time  . warfarin (COUMADIN) 10 MG tablet Take 5 mg by mouth daily.   10/12/2014 at 1800    Assessment: 69 yo obese M presented with knee pain, fracture.  He is on chronic Coumadin 5mg  daily.  Exact indication unclear-unable to arouse patient to discuss at this time. INR therapeutic on admission.  No bleeding noted.   Goal of  Therapy:  INR 2-3   Plan:  Coumadin 5mg  po x1 today Daily INR  Zarah Carbon, Lavonia Drafts 10/14/2014,1:47 PM

## 2014-10-14 NOTE — ED Notes (Signed)
0815 pt can go to floor.

## 2014-10-14 NOTE — ED Provider Notes (Signed)
Handoff at shift change to follow-up on labs and x-rays.  X-rays at been reviewed.  There is a questionable knee fracture.  Hip and femur normal.  Labs reviewed showing that he has renal insufficiency with a potassium of 5.9 and a creatinine of 3.04.  He also of urinary retention.  A Foley was placed.  He had 1700 cc of urine in his bladder.  Urine was reviewed.  She does not have any sign of a urinary tract infection. I spoke with the, hospitalist, who request a renal ultrasound, EKG patient be admitted to telemetry and a repeat i-STAT after the IV fluids  Junius Creamer, NP 10/14/14 6073  Rolland Porter, MD 10/14/14 7106

## 2014-10-14 NOTE — ED Notes (Signed)
Unable to collect labs at this time staff working with patient 

## 2014-10-14 NOTE — H&P (Signed)
[facility] History and Physical  Dale Henry VEL:[id number] DOB: 05-25-1945 DOA: 10/13/2014  Referring physician: ED physician, Dr. Rolland Porter PCP: Pt sees doctor in [facility], Dr. Ella Jubilee   Chief Complaint: left knee pain   HPI:  Pt is 69 yo male, morbidly obese, with HTN, HLD, DM, on Coumadin (pt says has had blocked artery in the neck but not sure exactly) presented to [facility] ED via EMS for evaluation of left knee pain. Pt explains he was going up the stairs when he had his left knee flexed and he suddenly felt sharp pain with a popping sound in the knee. He explains the pain has initially been intermittent but now it's constant and sharp, 10/10 in severity, occasionally but not consistently radiating a few inches above and few inches below the left knee, no specific alleviating factors including tramadol that he tried taking at home, worse with minimal movement. Patient explains he is unable to bear weight. He denies fevers and chills, no chest pain or shortness of breath at this time, no specific abdominal or urinary concerns. Patient denies any trauma to the area, no falls. Patient also denies similar events in the past. Pt denies any numbness or tingling in the lower extremities.   In ED, patient noted to be hemodynamically stable except initial oxygen saturation 88% on room air. Blood work notable for Hg 9.2, K 5.9, CO2 18, BUN 45, Cr 3.06. Imaging studies included CT of the left knee w/o contrast and notable for nondisplaced fracture of the posterior tibia at the PCL insertion with moderate joint effusion. Orthopedic team was consulted for further assistance, [facility] asked to admit for further evaluation.  Please note that records from PCP requested.  Assessment and Plan:  Active Problems: Left posterior tibial nondisplaced fracture - Orthopedic team consulted for further assistance and recommendations - Provide analgesia as needed - Will eventually need PT  once able to tolerate  Acute renal failure, ? 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 Medication side effect, ATN - Hold lisinopril and other nephrotoxic medications  - Requested renal ultrasound, urine sodium and urine creatinine  - Patient has received 1 L of normal saline in emergency department  - We'll hold fluids for now as patient with some crackles on exam  - Repeat BMP in the morning, consider nephrologist consultation  - Please note, I have requested records from patient's primary care physician  Acute hypoxic respiratory failure - Questionable OHS versus pulmonary vascular congestion - Chest x-ray requested - Hold IV fluids for now - May need echocardiogram but will first wait for records from primary care physician in case patient had this done within the past year - Keep on oxygen via nasal cannula  Hyperkalemia with metabolic acidosis - Secondary to acute renal failure  - Give one dose of Kayexalate  - Repeat BMP now and again in the morning  - Ensure monitoring on telemetry due to hyperkalemia   Anemia of chronic disease, ? IDA and ? 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  DVT prophylaxis -  on Coumadin   Radiological Exams on Admission: Ct Knee Left Wo Contrast 10/14/2014   Small nondisplaced fracture of the posterior tibia at the PCL insertion. 2. Nonspecific, circumferential edema in the subcutaneous fat around the left knee. 3. Moderate joint effusion. 4. Peripheral vascular disease.     Dg Knee Complete 4 Views Left 10/13/2014   Subtle lucency involving the proximal-mid fibular diaphysis which may reflect a vascular foramen versus a nondisplaced fracture.    Dg Hip Unilat With Pelvis 2-3 Views Left 10/14/2014   Degenerative changes.  No acute fractures demonstrated.   Dg Femur Min 2 Views Left 10/14/2014  No acute bony abnormalities.     Code Status: Full Family Communication: Pt and wife at bedside Disposition Plan: Admit for further evaluation    [facility] Pig[facility] [phone]   Review of Systems:  Constitutional: Negative for fever, chills. Negative for diaphoresis.  HENT: Negative for hearing loss, ear pain, nosebleeds, congestion, sore throat, neck pain, tinnitus and ear discharge.   Eyes: Negative for blurred vision, double vision, photophobia, pain, discharge and redness.  Respiratory: Negative for  wheezing and stridor.   Cardiovascular: Negative for chest pain, palpitations, orthopnea, claudication.  Gastrointestinal: Negative for nausea, vomiting and abdominal pain. Negative for heartburn, constipation, blood in stool and melena.  Genitourinary: Negative for dysuria, urgency, frequency, hematuria and flank pain.  Musculoskeletal: Negative for myalgias, back pain.  Skin: Negative for itching and rash.  Neurological: Negative for tingling, tremors, sensory change, speech change, focal weakness, loss of consciousness and headaches.  Endo/Heme/Allergies: Negative for environmental allergies and polydipsia. Does not  bruise/bleed easily.  Psychiatric/Behavioral: Negative for suicidal ideas. The patient is not nervous/anxious.      Past Medical History  Diagnosis Date  . Morbid obesity    hypertension, hyperlipidemia, diabetes mellitus  No past surgical history on file.  Social History:  reports that he has never smoked. He does not have any smokeless tobacco history on file. He reports that he does not drink alcohol. His drug history is not on file.  Allergies  Allergen Reactions  . Codeine Itching  . Shellfish Allergy Nausea Only   Patient reports family medical history of hypertension and hyperlipidemia   Medication Sig  allopurinol (ZYLOPRIM) 300 MG tablet Take 300 mg by mouth daily.  amLODipine (NORVASC) 10 MG tablet Take 10 mg by mouth daily.  atorvastatin (LIPITOR) 20 MG tablet Take 20 mg by mouth at bedtime.  cloNIDine (CATAPRES) 0.2 MG tablet Take 0.2 mg by mouth 3 (three) times daily.  diphenhydrAMINE (BENADRYL) 25 MG tablet Take 25 mg by mouth every 6 (six) hours as needed for itching.  docusate sodium (COLACE) 100 MG capsule Take 100 mg by mouth 2 (two) times daily.  ferrous sulfate 325 (65 FE) MG tablet Take 325 mg by mouth daily with breakfast.  gabapentin (NEURONTIN) 100 MG capsule Take 100 mg by mouth 2 (two) times daily.  hydrOXYzine (ATARAX/VISTARIL) 25 MG tablet Take 25 mg by mouth 2 (two) times daily.  insulin NPH Human (HUMULIN N,NOVOLIN N) 100 UNIT/ML injection Inject 60-70 Units into the skin 2 (two) times daily before a meal. Take 70 units every morning and Take 60 units every evening.  insulin regular (NOVOLIN R,HUMULIN R) 100 units/mL injection Inject 3 Units into the skin 3 (three) times daily as needed for high blood sugar.  levocetirizine (XYZAL) 5 MG tablet Take 5 mg by mouth every evening.  lisinopril (PRINIVIL,ZESTRIL) 40 MG tablet Take 40 mg by mouth 2 (two) times daily.  Nystatin (NYAMYC) 100000 UNIT/GM POWD Apply 1 g topically 2 (two) times daily.   omeprazole  (PRILOSEC) 20 MG capsule Take 20 mg by mouth daily.  pramipexole (MIRAPEX) 1 MG tablet Take 1 mg by mouth at bedtime.  Probiotic CAPS Take 1 capsule by mouth daily.  SSD 1 % cream Apply 1 application topically daily as needed (sores).   tamsulosin (FLOMAX) 0.4 MG CAPS capsule Take 0.4 mg by mouth daily.  tiZANidine (ZANAFLEX) 2 MG tablet Take 2 mg by mouth 2 (two) times daily.  traMADol (ULTRAM) 50 MG tablet Take 50 mg by mouth every 6 (six) hours as needed for moderate pain or severe pain.  Triamcinolone Acetonide (TRIAMCINOLONE 0.1 % CREAM : EUCERIN) CREA Apply 1 application topically daily.  warfarin (COUMADIN) 10 MG tablet Take 5 mg by mouth daily.    Physical Exam: Filed Vitals:   10/14/14 0530 10/14/14 0600 10/14/14 0630 10/14/14 0817  BP: 115/47 119/49 138/57 146/47  Pulse: 69 69 76 77  Temp:      TempSrc:      Resp:    15  Height:      Weight:      SpO2: 96% 92% 96% 97%    Physical Exam  Constitutional: Appears morbidly obese, NAD  HENT: Normocephalic. External right and left ear normal. Oropharynx is clear and moist.  Eyes: Conjunctivae and EOM are normal. PERRLA, no scleral icterus.  Neck: Normal ROM. Neck supple. No JVD. No tracheal deviation. No thyromegaly.  CVS: RRR, no gallops, no carotid bruit.  Pulmonary: Effort and breath sounds normal, no wheezing, bibasilar crackles Abdominal: Soft. BS +,  no distension, tenderness, rebound or guarding.  Musculoskeletal: Range of motion limited in left lower extremity due to pain, Bilateral lower extremity edema   Lymphadenopathy: No lymphadenopathy noted, cervical, inguinal. Neuro: Alert. Normal reflexes, muscle tone coordination. No cranial nerve deficit. Skin: Skin is warm and dry. No rash noted. Not diaphoretic. No erythema. No pallor.  Psychiatric: Normal mood and affect. Behavior, judgment, thought content normal.   Labs on Admission:  Basic Metabolic Panel:  Recent Labs Lab 10/14/14 0237 10/14/14 0646  NA 139  140  K 5.9* 6.1*  CL 113* 114*  CO2 18*  --   GLUCOSE 92 91  BUN 45* 40*  CREATININE 3.06* 2.90*  CALCIUM 8.4*  --    CBC:  Recent Labs Lab 10/14/14 0237 10/14/14 0646  WBC 6.4  --   NEUTROABS 4.4  --   HGB 9.2* 9.5*  HCT 29.7* 28.0*  MCV 87.6  --   PLT 181  --    CBG:  Recent Labs Lab 10/13/14 2235  GLUCAP 103*    EKG: pending    If 7PM-7AM, please contact night-coverage [email or web address] Password [facility] 10/14/2014, 8:23 AM

## 2014-10-14 NOTE — Consult Note (Signed)
Reason for Consult: Left knee fracture Referring Physician: Doyle Askew, MD  Dale Henry is an 69 y.o. male.  HPI: PCP: Pt sees doctor in [facility], Transport planner, Dr. 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 Pt explains he was going up the stairs when he had his left knee flexed and he suddenly felt sharp pain with a popping sound in the knee. He explains the pain has initially been intermittent but now it's constant and sharp, 10/10 in severity, occasionally but not consistently radiating a few inches above and few inches below the left knee, no specific alleviating factors including tramadol that he tried taking at home, worse with minimal movement. Patient explains he is unable to bear weight. He denies fevers and chills, no chest pain or shortness of breath at this time, no specific abdominal or urinary concerns. Patient denies any trauma to the area, no falls. Patient also denies similar events in the past. Pt denies any numbness or tingling in the lower extremities.   In ED, patient noted to be hemodynamically stable except initial oxygen saturation 88% on room air. Blood work notable for Hg 9.2, K 5.9, CO2 18, BUN 45, Cr 3.06. Imaging studies included CT of the left knee w/o contrast and notable for nondisplaced fracture of the posterior tibia at the PCL insertion with moderate joint effusion. Orthopedic team was consulted for further assistance, [facility] asked to admit for further evaluation.  Reviewed injury pattern with him.  Despite weight greater than 420lbs still tries to negotiate 3 steps within his house by trying to pull himself up.  This time not successful.  Worry about ultimate disposition if this will be necessary at home,  Past Medical History  Diagnosis Date  . Morbid  obesity   . Diabetes mellitus without complication     History reviewed. No pertinent past surgical history.  History reviewed. No pertinent family history.  Social History:  reports that he has never smoked. He has never used smokeless tobacco. He reports that he does not drink alcohol or use illicit drugs.  Allergies:  Allergies  Allergen Reactions  . Codeine Itching  . Shellfish Allergy Nausea Only    Medications:  I have reviewed the patient's current medications. Scheduled: . allopurinol  300 mg Oral Daily  . amLODipine  10 mg Oral Daily  . atorvastatin  20 mg Oral QHS  . cloNIDine  0.2 mg Oral TID  . docusate sodium  100 mg Oral BID  . ferrous sulfate  325 mg Oral Q breakfast  . gabapentin  100 mg Oral BID  . hydrOXYzine  25 mg Oral BID  . insulin aspart  0-9 Units Subcutaneous 6 times per day  . insulin NPH Human  60 Units Subcutaneous QAC supper  . insulin NPH Human  70 Units Subcutaneous QAC breakfast  . loratadine  10 mg Oral Daily  . nystatin   Topical BID  . pantoprazole  40 mg Oral Daily  . pramipexole  1 mg Oral QHS  . sodium chloride  3 mL Intravenous Q12H  . tamsulosin  0.4 mg Oral Daily  . tiZANidine  2 mg Oral BID  . triamcinolone 0.1 % cream : eucerin  1 application Topical Daily  . warfarin  5  mg Oral ONCE-1800  . [START ON 10/15/2014] Warfarin - Pharmacist Dosing Inpatient   Does not apply q1800    Results for orders placed or performed during the hospital encounter of 10/13/14 (from the past 24 hour(s))  CBG monitoring, ED     Status: Abnormal   Collection Time: 10/13/14 10:35 PM  Result Value Ref Range   Glucose-Capillary 103 (H) 65 - 99 mg/dL  CBC with Differential     Status: Abnormal   Collection Time: 10/14/14  2:37 AM  Result Value Ref Range   WBC 6.4 4.0 - 10.5 K/uL   RBC 3.39 (L) 4.22 - 5.81 MIL/uL   Hemoglobin 9.2 (L) 13.0 - 17.0 g/dL   HCT 29.7 (L) 39.0 - 52.0 %   MCV 87.6 78.0 - 100.0 fL   MCH 27.1 26.0 - 34.0 pg   MCHC 31.0  30.0 - 36.0 g/dL   RDW 16.5 (H) 11.5 - 15.5 %   Platelets 181 150 - 400 K/uL   Neutrophils Relative % 69 43 - 77 %   Neutro Abs 4.4 1.7 - 7.7 K/uL   Lymphocytes Relative 17 12 - 46 %   Lymphs Abs 1.1 0.7 - 4.0 K/uL   Monocytes Relative 9 3 - 12 %   Monocytes Absolute 0.6 0.1 - 1.0 K/uL   Eosinophils Relative 5 0 - 5 %   Eosinophils Absolute 0.3 0.0 - 0.7 K/uL   Basophils Relative 0 0 - 1 %   Basophils Absolute 0.0 0.0 - 0.1 K/uL  Basic metabolic panel     Status: Abnormal   Collection Time: 10/14/14  2:37 AM  Result Value Ref Range   Sodium 139 135 - 145 mmol/L   Potassium 5.9 (H) 3.5 - 5.1 mmol/L   Chloride 113 (H) 101 - 111 mmol/L   CO2 18 (L) 22 - 32 mmol/L   Glucose, Bld 92 65 - 99 mg/dL   BUN 45 (H) 6 - 20 mg/dL   Creatinine, Ser 3.06 (H) 0.61 - 1.24 mg/dL   Calcium 8.4 (L) 8.9 - 10.3 mg/dL   GFR calc non Af Amer 19 (L) >60 mL/min   GFR calc Af Amer 22 (L) >60 mL/min   Anion gap 8 5 - 15  Urinalysis with microscopic     Status: Abnormal   Collection Time: 10/14/14  3:30 AM  Result Value Ref Range   Color, Urine YELLOW YELLOW   APPearance CLEAR CLEAR   Specific Gravity, Urine 1.014 1.005 - 1.030   pH 5.5 5.0 - 8.0   Glucose, UA NEGATIVE NEGATIVE mg/dL   Hgb urine dipstick LARGE (A) NEGATIVE   Bilirubin Urine NEGATIVE NEGATIVE   Ketones, ur NEGATIVE NEGATIVE mg/dL   Protein, ur 100 (A) NEGATIVE mg/dL   Urobilinogen, UA 0.2 0.0 - 1.0 mg/dL   Nitrite NEGATIVE NEGATIVE   Leukocytes, UA NEGATIVE NEGATIVE   WBC, UA 0-2 <3 WBC/hpf   RBC / HPF 7-10 <3 RBC/hpf   Bacteria, UA FEW (A) RARE   Squamous Epithelial / LPF RARE RARE   Casts GRANULAR CAST (A) NEGATIVE  I-stat chem 8, ed     Status: Abnormal   Collection Time: 10/14/14  6:46 AM  Result Value Ref Range   Sodium 140 135 - 145 mmol/L   Potassium 6.1 (HH) 3.5 - 5.1 mmol/L   Chloride 114 (H) 101 - 111 mmol/L   BUN 40 (H) 6 - 20 mg/dL   Creatinine, Ser 2.90 (H) 0.61 - 1.24 mg/dL   Glucose,  Bld 91 65 - 99 mg/dL    Calcium, Ion 1.05 (L) 1.13 - 1.30 mmol/L   TCO2 17 0 - 100 mmol/L   Hemoglobin 9.5 (L) 13.0 - 17.0 g/dL   HCT 28.0 (L) 39.0 - 52.0 %   Comment NOTIFIED PHYSICIAN   Glucose, capillary     Status: None   Collection Time: 10/14/14  8:48 AM  Result Value Ref Range   Glucose-Capillary 89 65 - 99 mg/dL  Glucose, capillary     Status: Abnormal   Collection Time: 10/14/14 11:51 AM  Result Value Ref Range   Glucose-Capillary 144 (H) 65 - 99 mg/dL  Protime-INR     Status: Abnormal   Collection Time: 10/14/14 12:10 PM  Result Value Ref Range   Prothrombin Time 25.0 (H) 11.6 - 15.2 seconds   INR 2.29 (H) 0.00 - 4.09  Basic metabolic panel     Status: Abnormal   Collection Time: 10/14/14  1:25 PM  Result Value Ref Range   Sodium 141 135 - 145 mmol/L   Potassium 6.0 (H) 3.5 - 5.1 mmol/L   Chloride 114 (H) 101 - 111 mmol/L   CO2 19 (L) 22 - 32 mmol/L   Glucose, Bld 135 (H) 65 - 99 mg/dL   BUN 40 (H) 6 - 20 mg/dL   Creatinine, Ser 2.89 (H) 0.61 - 1.24 mg/dL   Calcium 8.4 (L) 8.9 - 10.3 mg/dL   GFR calc non Af Amer 20 (L) >60 mL/min   GFR calc Af Amer 24 (L) >60 mL/min   Anion gap 8 5 - 15    X-ray: CLINICAL DATA: Left leg pain. Twisted left knee. Unable to bear weight.  EXAM: CT OF THE LEFT KNEE WITHOUT CONTRAST  TECHNIQUE: Multidetector CT imaging of the LEFT knee was performed according to the standard protocol. Multiplanar CT image reconstructions were also generated.  COMPARISON: None.  FINDINGS: There is severe osteopenia. There is a small nondisplaced fracture at the PCL insertion involving the posterior tibia. There is no other fracture or dislocation. There is a moderate joint effusion.  There is no lytic or sclerotic osseous lesion. There is no bone destruction.  There is severe soft tissue edema in the subcutaneous fat circumferentially around the left knee with skin thickening. There is no drainable fluid collection or hematoma. There is  peripheral vascular atherosclerotic disease.  IMPRESSION: 1. Small nondisplaced fracture of the posterior tibia at the PCL insertion. 2. Nonspecific, circumferential edema in the subcutaneous fat around the left knee. 3. Moderate joint effusion. 4. Peripheral vascular disease.   Electronically Signed  By: Kathreen Devoid  ROS  Review of Systems:  Constitutional: Negative for fever, chills. Negative for diaphoresis.  HENT: Negative for hearing loss, ear pain, nosebleeds, congestion, sore throat, neck pain, tinnitus and ear discharge.  Eyes: Negative for blurred vision, double vision, photophobia, pain, discharge and redness.  Respiratory: Negative for wheezing and stridor.  Cardiovascular: Negative for chest pain, palpitations, orthopnea, claudication.  Gastrointestinal: Negative for nausea, vomiting and abdominal pain. Negative for heartburn, constipation, blood in stool and melena.  Genitourinary: Negative for dysuria, urgency, frequency, hematuria and flank pain.  Musculoskeletal: Negative for myalgias, back pain.  Skin: Negative for itching and rash.  Neurological: Negative for tingling, tremors, sensory change, speech change, focal weakness, loss of consciousness and headaches.  Endo/Heme/Allergies: Negative for environmental allergies and polydipsia. Does not bruise/bleed easily.  Psychiatric/Behavioral: Negative for suicidal ideas. The patient is not nervous/anxious.   Blood pressure 150/65, pulse 87, temperature 98.3  F (36.8 C), temperature source Oral, resp. rate 16, height 5\' 5"  (1.651 m), weight 192.3 kg (423 lb 15.1 oz), SpO2 93 %.  Physical Exam   Morbid obesity Bilateral LE with significant venous stasis changes Difficult to assess knee effusion Some pain with motion of left knee  No right lower extremity acute pains  Otherwise admitting H&P exam reviewed at tim e of evaluation   Assessment/Plan: Non-displaced posterior tibial fracture, findings  radiographically consistent with PCL avulsion  Due to the significant challenges his weight poses on any functional activity I will not restrict him other than his own pain. So, WBAT LLE PT eval and treat with dispo recs given home envioronment No Ortho follow up mandatory, ultimate dispo to home when safe   Sherae Santino D 10/14/2014, 2:03 PM

## 2014-10-14 NOTE — Progress Notes (Signed)
Received from ED.  

## 2014-10-14 NOTE — ED Notes (Signed)
US tech at bedside

## 2014-10-14 NOTE — ED Notes (Signed)
Requested bariatric bed 10

## 2014-10-15 DIAGNOSIS — E875 Hyperkalemia: Secondary | ICD-10-CM

## 2014-10-15 DIAGNOSIS — N183 Chronic kidney disease, stage 3 (moderate): Secondary | ICD-10-CM

## 2014-10-15 DIAGNOSIS — N179 Acute kidney failure, unspecified: Secondary | ICD-10-CM

## 2014-10-15 DIAGNOSIS — J9601 Acute respiratory failure with hypoxia: Secondary | ICD-10-CM

## 2014-10-15 DIAGNOSIS — I10 Essential (primary) hypertension: Secondary | ICD-10-CM

## 2014-10-15 LAB — BASIC METABOLIC PANEL
ANION GAP: 8 (ref 5–15)
Anion gap: 5 (ref 5–15)
BUN: 40 mg/dL — ABNORMAL HIGH (ref 6–20)
BUN: 39 mg/dL — ABNORMAL HIGH (ref 6–20)
CALCIUM: 8.1 mg/dL — AB (ref 8.9–10.3)
CHLORIDE: 114 mmol/L — AB (ref 101–111)
CHLORIDE: 113 mmol/L — AB (ref 101–111)
CO2: 21 mmol/L — ABNORMAL LOW (ref 22–32)
CO2: 19 mmol/L — ABNORMAL LOW (ref 22–32)
CREATININE: 2.82 mg/dL — AB (ref 0.61–1.24)
CREATININE: 2.74 mg/dL — AB (ref 0.61–1.24)
Calcium: 8.1 mg/dL — ABNORMAL LOW (ref 8.9–10.3)
GFR calc non Af Amer: 21 mL/min — ABNORMAL LOW (ref >60)
GFR calc non Af Amer: 22 mL/min — ABNORMAL LOW (ref >60)
GFR, EST AFRICAN AMERICAN: 24 mL/min — AB (ref >60)
GFR, EST AFRICAN AMERICAN: 25 mL/min — AB (ref >60)
Glucose, Bld: 171 mg/dL — ABNORMAL HIGH (ref 65–99)
Glucose, Bld: 134 mg/dL — ABNORMAL HIGH (ref 65–99)
POTASSIUM: 5.3 mmol/L — AB (ref 3.5–5.1)
Potassium: 5.8 mmol/L — ABNORMAL HIGH (ref 3.5–5.1)
SODIUM: 140 mmol/L (ref 135–145)
SODIUM: 140 mmol/L (ref 135–145)

## 2014-10-15 LAB — LIPID PANEL
CHOL/HDL RATIO: 3.7 ratio
Cholesterol: 126 mg/dL (ref 0–200)
HDL: 34 mg/dL — AB (ref >40)
LDL CALC: 75 mg/dL (ref 0–99)
Triglycerides: 83 mg/dL (ref <150)
VLDL: 17 mg/dL (ref 0–40)

## 2014-10-15 LAB — CBC
HEMATOCRIT: 28.1 % — AB (ref 39.0–52.0)
HEMOGLOBIN: 9 g/dL — AB (ref 13.0–17.0)
MCH: 28.3 pg (ref 26.0–34.0)
MCHC: 32 g/dL (ref 30.0–36.0)
MCV: 88.4 fL (ref 78.0–100.0)
PLATELETS: 195 10*3/uL (ref 150–400)
RBC: 3.18 MIL/uL — AB (ref 4.22–5.81)
RDW: 16.4 % — ABNORMAL HIGH (ref 11.5–15.5)
WBC: 5.9 10*3/uL (ref 4.0–10.5)

## 2014-10-15 LAB — GLUCOSE, CAPILLARY
GLUCOSE-CAPILLARY: 108 mg/dL — AB (ref 65–99)
GLUCOSE-CAPILLARY: 137 mg/dL — AB (ref 65–99)
Glucose-Capillary: 160 mg/dL — ABNORMAL HIGH (ref 65–99)
Glucose-Capillary: 127 mg/dL — ABNORMAL HIGH (ref 65–99)

## 2014-10-15 LAB — HEMOGLOBIN A1C
Hgb A1c MFr Bld: 6.8 % — ABNORMAL HIGH (ref 4.8–5.6)
Mean Plasma Glucose: 148 mg/dL

## 2014-10-15 LAB — PROTIME-INR
INR: 2.28 — ABNORMAL HIGH (ref 0.00–1.49)
Prothrombin Time: 24.9 seconds — ABNORMAL HIGH (ref 11.6–15.2)

## 2014-10-15 MED ORDER — SODIUM POLYSTYRENE SULFONATE 15 GM/60ML PO SUSP
45.0000 g | Freq: Once | ORAL | Status: AC
  Administered 2014-10-15: 45 g via ORAL
  Filled 2014-10-15: qty 180

## 2014-10-15 MED ORDER — WARFARIN SODIUM 5 MG PO TABS
5.0000 mg | ORAL_TABLET | Freq: Once | ORAL | Status: AC
  Administered 2014-10-15: 5 mg via ORAL
  Filled 2014-10-15: qty 1

## 2014-10-15 MED ORDER — SODIUM CHLORIDE 0.9 % IV SOLN
INTRAVENOUS | Status: DC
  Administered 2014-10-15 (×2): via INTRAVENOUS

## 2014-10-15 NOTE — Progress Notes (Signed)
Spoke to Dr Algis Liming place cont pulse ox for apnea monitor and have HS shift colsely monitor for OSA

## 2014-10-15 NOTE — Progress Notes (Signed)
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 Codeine Itching  . Shellfish Allergy Nausea Only    Patient Measurements: Height: 5\' 5"  (165.1 cm) Weight: (!) 419 lb (190.057 kg) IBW/kg (Calculated) : 61.5  Vital Signs: Temp: 98 F (36.7 C) (08/26 0437) Temp Source: Oral (08/26 0437) BP: 145/57 mmHg (08/26 0437) Pulse Rate: 81 (08/26 0437)  Labs:  Recent Labs  10/14/14 0237 10/14/14 0646 10/14/14 1210 10/14/14 1325 10/14/14 2230 10/15/14 0509  HGB 9.2* 9.5*  --   --   --  9.0*  HCT 29.7* 28.0*  --   --   --  28.1*  PLT 181  --   --   --   --  195  LABPROT  --   --  25.0*  --   --  24.9*  INR  --   --  2.29*  --   --  2.28*  CREATININE 3.06* 2.90*  --  2.89* 2.82* 2.74*    Estimated Creatinine Clearance: 39.5 mL/min (by C-G formula based on Cr of 2.74).   Medical History: Past Medical History  Diagnosis Date  . Morbid obesity   . Diabetes mellitus without complication     Medications:  Prescriptions prior to admission  Medication Sig Dispense Refill Last Dose  . allopurinol (ZYLOPRIM) 300 MG tablet Take 300 mg by mouth daily.   10/13/2014 at Unknown time  . amLODipine (NORVASC) 10 MG tablet Take 10 mg by mouth daily.   10/13/2014 at Unknown time  . atorvastatin (LIPITOR) 20 MG tablet Take 20 mg by mouth at bedtime.   10/12/2014 at Unknown time  . cloNIDine (CATAPRES) 0.2 MG tablet Take 0.2 mg by mouth 3 (three) times daily.   10/13/2014 at 1400  . diphenhydrAMINE (BENADRYL) 25 MG tablet Take 25 mg by mouth every 6 (six) hours as needed for itching.   10/12/2014 at Unknown time  . docusate sodium (COLACE) 100 MG capsule Take 100 mg by mouth 2 (two) times daily.   10/13/2014 at Unknown time  . ferrous sulfate 325 (65 FE) MG tablet Take 325 mg by mouth daily with breakfast.   10/13/2014 at Unknown time  . gabapentin (NEURONTIN) 100 MG capsule Take 100 mg by mouth 2 (two) times daily.    10/13/2014 at Unknown time  . hydrOXYzine (ATARAX/VISTARIL) 25 MG tablet Take 25 mg by mouth 2 (two) times daily.   10/13/2014 at Unknown time  . insulin NPH Human (HUMULIN N,NOVOLIN N) 100 UNIT/ML injection Inject 60-70 Units into the skin 2 (two) times daily before a meal. Take 70 units every morning and Take 60 units every evening.   10/13/2014 at Unknown time  . insulin regular (NOVOLIN R,HUMULIN R) 100 units/mL injection Inject 3 Units into the skin 3 (three) times daily as needed for high blood sugar.   10/13/2014 at Unknown time  . levocetirizine (XYZAL) 5 MG tablet Take 5 mg by mouth every evening.   10/12/2014 at Unknown time  . lisinopril (PRINIVIL,ZESTRIL) 40 MG tablet Take 40 mg by mouth 2 (two) times daily.   10/13/2014 at Unknown time  . Nystatin (NYAMYC) 100000 UNIT/GM POWD Apply 1 g topically 2 (two) times daily.   4 10/13/2014 at Unknown time  . omeprazole (PRILOSEC) 20 MG capsule Take 20 mg by mouth daily.   10/13/2014 at Unknown time  . pramipexole (MIRAPEX) 1 MG tablet Take 1 mg by mouth at bedtime.   10/12/2014  at Unknown time  . Probiotic CAPS Take 1 capsule by mouth daily.   10/13/2014 at Unknown time  . SSD 1 % cream Apply 1 application topically daily as needed (sores).   0 Past Month at Unknown time  . tamsulosin (FLOMAX) 0.4 MG CAPS capsule Take 0.4 mg by mouth daily.   10/13/2014 at Unknown time  . tiZANidine (ZANAFLEX) 2 MG tablet Take 2 mg by mouth 2 (two) times daily.   10/13/2014 at Unknown time  . traMADol (ULTRAM) 50 MG tablet Take 50 mg by mouth every 6 (six) hours as needed for moderate pain or severe pain.   10/12/2014 at Unknown time  . Triamcinolone Acetonide (TRIAMCINOLONE 0.1 % CREAM : EUCERIN) CREA Apply 1 application topically daily.   10/13/2014 at Unknown time  . warfarin (COUMADIN) 10 MG tablet Take 5 mg by mouth daily.   10/12/2014 at 1800    Assessment: 69 yo obese M presented with knee pain, fracture.  He is on chronic Coumadin 5mg  daily.  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  He is followed by a Coumadin clinic in [facility] and most recently has had home health nurse to come draw his INR.  He states he has been on 5mg  daily for quite a while.  INR therapeutic on admission.  No bleeding noted.   10/15/2014:  INR remains therapeutic No bleeding noted.   Goal of Therapy:  INR 2-3   Plan:  Coumadin 5mg  po x1 today Daily INR  Taisa Deloria, Lavonia Drafts 10/15/2014,8:43 AM

## 2014-10-15 NOTE — Evaluation (Signed)
Physical Therapy Evaluation Patient Details Name: Dale Henry MRN: [id number] DOB: Oct 16, 1945 Today's Date: 10/15/2014   History of Present Illness  69 yo male, morbidly obese, with hx HTN, HLD, DM, on Coumadin presented to [facility] ED via EMS for evaluation of left knee pain with imaging revealing Small nondisplaced fracture of the posterior tibia at the PCL  Clinical Impression  Pt admitted with above diagnosis. Pt currently with functional limitations due to the deficits listed below (see PT Problem List).  Pt will benefit from skilled PT to increase their independence and safety with mobility to allow discharge to the venue listed below.   Pt reports knee injury from falling on steps entering home.  Pt states he has been in bed 2 days and presents with weakness and limited endurance.  Pt requiring rest breaks as well as assist for transferring today and encouraged by no knee pain during weight bearing.  Pt would benefit from ST-SNF to assist him with returning to his baseline mobility however if d/c home, recommend ramp and properly functioning hospital bed (reports his hospital bed does not raise from floor making transfers difficult).  Pt states his baseline is ambulatory with short distances using RW however typically relies on motorized scooter for mobility.     Follow Up Recommendations SNF    Equipment Recommendations  Other (comment) (need repair to current hospital bed, ramp)    Recommendations for Other Services       Precautions / Restrictions Precautions Precautions: Fall Restrictions Other Position/Activity Restrictions: WBAT L LE       Mobility  Bed Mobility Overal bed mobility: Needs Assistance;+ 2 for safety/equipment;+2 for physical assistance Bed Mobility: Supine to Sit;Sit to Supine     Supine to sit: Mod assist Sit to supine: Mod assist   General bed mobility comments: attempted to position bed similar to home environment, assist for scooting to EOB and trunk  upright, assist for LEs onto bed, increased time and effort to allow pt to self assist  Transfers Overall transfer level: Needs assistance Equipment used: Rolling walker (2 wheeled) Transfers: Sit to/from Stand Sit to Stand: Max assist;+2 physical assistance;From elevated surface         General transfer comment: initial attempt pt unable, pt reports rocking technique for assist so able to stand with max assist upon 3rd rock forward with verbal cues, pt reports no knee pain standing, able to take a couple steps toward [facility] min/guard for safety  Ambulation/Gait                Stairs            Wheelchair Mobility    Modified Rankin (Stroke Patients Only)       Balance Overall balance assessment: History of Falls                                           Pertinent Vitals/Pain Pain Assessment: No/denies pain (fearful of pain however reports no pain in standing)    Home Living Family/patient expects to be discharged to:: Private residence Living Arrangements: Spouse/significant other   Type of Home: House Home Access: Stairs to enter Entrance Stairs-Rails: Right Entrance Stairs-Number of Steps: 3 Home Layout: One level Home Equipment: Acupuncturist - 2 wheels Additional Comments: reports hospital bed does not raise from floor enough to assist him with transfers    Prior Function  Level of Independence: Needs assistance   Gait / Transfers Assistance Needed: typically short distance ambulator only with RW, has scooter for mobility   ADL's / Homemaking Assistance Needed: requires assist for bathing and dressing from spouse        Hand Dominance        Extremity/Trunk Assessment   Upper Extremity Assessment: RUE deficits/detail RUE Deficits / Details: reports hx of R rotator cuff tear with limited strength and motion         Lower Extremity Assessment: Generalized weakness;LLE deficits/detail   LLE Deficits  / Details: reports pain with repositioniong yesterday however pt able to actively move for transfers without knee pain  Cervical / Trunk Assessment: Other exceptions  Communication   Communication: HOH  Cognition Arousal/Alertness: Awake/alert Behavior During Therapy: WFL for tasks assessed/performed Overall Cognitive Status: Within Functional Limits for tasks assessed                      General Comments      Exercises        Assessment/Plan    PT Assessment Patient needs continued PT services  PT Diagnosis Difficulty walking;Generalized weakness   PT Problem List Decreased strength;Decreased activity tolerance;Decreased mobility;Obesity  PT Treatment Interventions DME instruction;Gait training;Functional mobility training;Patient/family education;Therapeutic activities;Therapeutic exercise   PT Goals (Current goals can be found in the Care Plan section) Acute Rehab PT Goals PT Goal Formulation: With patient/family Time For Goal Achievement: 10/22/14 Potential to Achieve Goals: Good    Frequency Min 3X/week   Barriers to discharge        Co-evaluation               End of Session   Activity Tolerance: Patient limited by fatigue Patient left: in bed;with call bell/phone within reach;with family/visitor present           Time: 0045-9977 PT Time Calculation (min) (ACUTE ONLY): 38 min   Charges:   PT Evaluation $Initial PT Evaluation Tier I: 1 Procedure PT Treatments $Therapeutic Activity: 8-22 mins   PT G Codes:        Dale Henry,Dale Henry 10/15/2014, 1:03 PM Carmelia Bake, PT, DPT 10/15/2014 Pager: [phone]

## 2014-10-15 NOTE — Care Management Note (Signed)
Case Management Note  Patient Details  Name: Dale Henry MRN: [id number] Date of Birth: November 18, 1945  Subjective/Objective:    Faxed w/confirmation to [facility] [phone] h&p,face sheet, hhrn/hhpt order.                Action/Plan:d/c plan home w/HHC.   Expected Discharge Date:   (unknown)               Expected Discharge Plan:  [place]  In-House Referral:     Discharge planning Services  CM Consult  Post Acute Care Choice:  Home Health (Active w/[facility]-HHPT) Choice offered to:  Patient  DME Arranged:    DME Agency:     HH Arranged:  RN, PT [place] Agency:  [place]  Status of Service:  In process, will continue to follow  Medicare Important Message Given:    Date Medicare IM Given:    Medicare IM give by:    Date Additional Medicare IM Given:    Additional Medicare Important Message give by:     If discussed at [place] of Stay Meetings, dates discussed:    Additional Comments:  [name], RN 10/15/2014, 2:01 PM

## 2014-10-15 NOTE — Progress Notes (Addendum)
PROGRESS NOTE    Dale Henry:[id number] DOB: 03-28-1945 DOA: 10/13/2014 PCP: PROVIDER NOT IN SYSTEM  HPI/Brief narrative 69 year old male patient, married, lives with spouse, ambulates with the help of a walker, PMH of DM 2, stage III chronic kidney disease, HTN, GERD, dermatitis/stasis dermatitis, gout, HLD, iron deficiency anemia, lymphedema of abdominal panniculus and legs, morbid obesity, PAD on Coumadin and TIA presented to the [facility] ED via EMS on 10/14/14 secondary to left knee pain that developed suddenly when he was climbing up stairs. This was associated with a popping sound in the knee. He was unable to bear weight. In ED, patient noted to be hemodynamically stable except initial oxygen saturation 88% on room air. Blood work notable for Hg 9.2, K 5.9, CO2 18, BUN 45, Cr 3.06. Imaging studies included CT of the left knee w/o contrast and notable for nondisplaced fracture of the posterior tibia at the PCL insertion with moderate joint effusion. Orthopedic team was consulted for further assistance, TRH asked to admit for further evaluation.   Assessment/Plan:  Active Problems: Nondisplaced left posterior tibial fracture, findings radiographically consistent with PCL avulsion - Orthopedics consulted on 8/25 and recommended: Due to significant challenges his weight poses on any functional activity, will not restrict him other than his own pain. So, WBAT on LLE and PT evaluation - No orthopedic follow-up  Acute on stage III chronic kidney disease - Reviewed the records from outpatient PCP. Creatinine of 2.6 and potassium 6.1 on 09/27/14 - Admitted with creatinine off 3.06 and potassium of 5.9 suggesting acute on chronic kidney disease - Acute renal failure may be precipitated by lisinopril and some degree of dehydration. - Renal ultrasound: Right kidney without hydronephrosis. Left kidney not visualized. - Briefly hydrate with IV fluids and follow BMP. Patient has  Foley catheter. - FeNA 6.8 ? ATN - Improving.  Acute hypoxic respiratory failure - Patient may have underlying undiagnosed OHS/OSA. He states that he's never had a sleep study which should be considered as outpatient. - Chest x-ray: No acute findings. - Oxygen supplementation.  Hyperkalemia with metabolic acidosis (mild, NAG) - Secondary to acute renal failure and ACEI  - Continue treating acute kidney injury with IV fluids. - Status post couple of doses of Kayexalate on 8/25. Potassium has improved from 5.9-5.3. We will repeat a dose of Kayexalate 45 g 1 - Follow BMP in a.m.  Anemia of chronic disease/iron deficiency/chronic kidney disease - Hemoglobin outpatient on 09/27/14 was 9.6. - Stable.  Essential hypertension - Continue Norvasc and clonidine the patient takes at home - Hold lisinopril as noted above  - Add hydralazine as needed  - Reasonable inpatient control.  Hyperlipidemia - continue statin per home medical regimen  - Lipid panel unremarkable except for HDL of 34.  Uncontrolled DM 2 with chronic kidney disease - Continue insulin per home medical regimen  - Add sliding scale insulin  - Continue gabapentin for neuropathies  - Reasonable inpatient control. - A1c on 09/27/14:6.7.  On Anticoagulation  - As per PCP notes, on anticoagulation for PAD - Continue Coumadin per pharmacy and defer further management to outpatient follow-up with PCP.  Morbid obesity - Body mass index is 70.55 kg/(m^2).  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 Was supposed to see podiatrist as outpatient. - No acute findings.   DVT prophylaxis: Anticoagulated on Coumadin Code Status: Full Family Communication: Discussed with spouse at bedside on 8/26 Disposition Plan: DC home  when medically stable.   Consultants:  Orthopedics  Procedures:  Indwelling Foley catheter.  Antibiotics:  None   Subjective: Left knee  pain is better. Denies dyspnea, cough, chest pain, abdominal pain, nausea or vomiting.  Objective: Filed Vitals:   10/14/14 1311 10/14/14 2207 10/15/14 0437 10/15/14 0958  BP: 150/65 147/62 145/57 164/63  Pulse: 87 85 81   Temp: 98.3 F (36.8 C) 98.6 F (37 C) 98 F (36.7 C)   TempSrc: Oral Oral Oral   Resp: 16 18 18    Height:      Weight:   190.057 kg (419 lb)   SpO2: 93% 95% 94%     Intake/Output Summary (Last 24 hours) at 10/15/14 1333 Last data filed at 10/15/14 0443  Gross per 24 hour  Intake   2160 ml  Output   2700 ml  Net   -540 ml   Filed Weights   10/13/14 2239 10/14/14 0852 10/15/14 0437  Weight: 188.243 kg (415 lb) 192.3 kg (423 lb 15.1 oz) 190.057 kg (419 lb)     Exam:  General exam: Moderately built and morbidly obese pleasant elderly male lying propped up in bed comfortably. Respiratory system: Clear. No increased work of breathing. Cardiovascular system: S1 & S2 heard, RRR. No JVD, murmurs, gallops, clicks. Telemetry sinus rhythm Gastrointestinal system: Abdomen is protuberant, soft and nontender. Normal bowel sounds heard.  Central nervous system: Alert and oriented. No focal neurological deficits. Extremities: Symmetric 5 x 5 power. Skin: Chronic skin changes of stasis dermatitis/lymphedema of abdominal panniculus and bilateral legs without acute findings.    Data Reviewed: Basic Metabolic Panel:  Recent Labs Lab 10/14/14 0237 10/14/14 0646 10/14/14 1325 10/14/14 2230 10/15/14 0509  NA 139 140 141 140 140  K 5.9* 6.1* 6.0* 5.8* 5.3*  CL 113* 114* 114* 114* 113*  CO2 18*  --  19* 21* 19*  GLUCOSE 92 91 135* 171* 134*  BUN 45* 40* 40* 40* 39*  CREATININE 3.06* 2.90* 2.89* 2.82* 2.74*  CALCIUM 8.4*  --  8.4* 8.1* 8.1*   Liver Function Tests: No results for input(s): AST, ALT, ALKPHOS, BILITOT, PROT, ALBUMIN in the last 168 hours. No results for input(s): LIPASE, AMYLASE in the last 168 hours. No results for input(s): AMMONIA in the last  168 hours. CBC:  Recent Labs Lab 10/14/14 0237 10/14/14 0646 10/15/14 0509  WBC 6.4  --  5.9  NEUTROABS 4.4  --   --   HGB 9.2* 9.5* 9.0*  HCT 29.7* 28.0* 28.1*  MCV 87.6  --  88.4  PLT 181  --  195   Cardiac Enzymes: No results for input(s): CKTOTAL, CKMB, CKMBINDEX, TROPONINI in the last 168 hours. BNP (last 3 results) No results for input(s): PROBNP in the last 8760 hours. CBG:  Recent Labs Lab 10/14/14 1151 10/14/14 1633 10/14/14 2212 10/15/14 0751 10/15/14 1145  GLUCAP 144* 115* 162* 108* 137*    No results found for this or any previous visit (from the past 240 hour(s)).      Studies: Dg Chest 2 View  10/14/2014   CLINICAL DATA:  Hypoxia  EXAM: CHEST - 2 VIEW  COMPARISON:  02/11/2014  FINDINGS: Cardiac shadow remains enlarged. The lungs are well aerated bilaterally. Pleural thickening is noted laterally on the right stable from the prior exam. No acute bony abnormality is seen.  IMPRESSION: No active disease.   Electronically Signed   By: Inez Catalina M.D.   On: 10/14/2014 13:27   Ct Knee Left Wo  Contrast  10/14/2014   CLINICAL DATA:  Left leg pain. Twisted left knee. Unable to bear weight.  EXAM: CT OF THE LEFT KNEE WITHOUT CONTRAST  TECHNIQUE: Multidetector CT imaging of the LEFT knee was performed according to the standard protocol. Multiplanar CT image reconstructions were also generated.  COMPARISON:  None.  FINDINGS: There is severe osteopenia. There is a small nondisplaced fracture at the PCL insertion involving the posterior tibia. There is no other fracture or dislocation. There is a moderate joint effusion.  There is no lytic or sclerotic osseous lesion. There is no bone destruction.  There is severe soft tissue edema in the subcutaneous fat circumferentially around the left knee with skin thickening. There is no drainable fluid collection or hematoma. There is peripheral vascular atherosclerotic disease.  IMPRESSION: 1. Small nondisplaced fracture of the  posterior tibia at the PCL insertion. 2. Nonspecific, circumferential edema in the subcutaneous fat around the left knee. 3. Moderate joint effusion. 4. Peripheral vascular disease.   Electronically Signed   By: Kathreen Devoid   On: 10/14/2014 08:19   US Renal  10/14/2014   CLINICAL DATA:  Renal insufficiency  EXAM: RENAL / URINARY TRACT ULTRASOUND COMPLETE  COMPARISON:  02/10/2014  FINDINGS: Right Kidney:  Length: 11.7 cm. Cortical echogenicity is grossly within normal limits. No obvious hydronephrosis. There is a 2.2 x 1.2 x 2.1 cm benign appearing cyst in the interpolar region.  Left Kidney:  Not visualized.  Bladder:  Not visualized.  IMPRESSION: Left kidney and bladder were not visualized likely due to body habitus.  No evidence of hydronephrosis in the left kidney. A 2.2 cm benign appearing cyst in the interpolar region is noted.   Electronically Signed   By: Marybelle Killings M.D.   On: 10/14/2014 08:29   Dg Knee Complete 4 Views Left  10/13/2014   CLINICAL DATA:  Status post fall on stairs at home.  EXAM: LEFT KNEE - COMPLETE 4+ VIEW  COMPARISON:  None.  FINDINGS: There is a subtle lucency involving the proximal-mid fibular diaphysis which may reflect a vascular foramen versus a nondisplaced fracture. There is no other acute fracture or dislocation. There is generalized osteopenia. There is no lytic or sclerotic osseous lesion. There is no significant joint effusion. There is peripheral vascular atherosclerotic disease.  IMPRESSION: Subtle lucency involving the proximal-mid fibular diaphysis which may reflect a vascular foramen versus a nondisplaced fracture.   Electronically Signed   By: Kathreen Devoid   On: 10/13/2014 23:30   Dg Hip Unilat With Pelvis 2-3 Views Left  10/14/2014   CLINICAL DATA:  Patient fell on steps on 08/24. Limited movement. Pain on palpation.  EXAM: DG HIP (WITH OR WITHOUT PELVIS) 2-3V LEFT  COMPARISON:  None.  FINDINGS: Degenerative changes in both hips. Pelvis and left hip appear  intact. No evidence of acute fracture or dislocation. No focal bone lesion or bone destruction. Vascular calcifications and calcified phleboliths.  IMPRESSION: Degenerative changes.  No acute fractures demonstrated.   Electronically Signed   By: Lucienne Capers M.D.   On: 10/14/2014 02:30   Dg Femur Min 2 Views Left  10/14/2014   CLINICAL DATA:  Pain after a fall.  EXAM: LEFT FEMUR 2 VIEWS  COMPARISON:  None.  FINDINGS: Mild degenerative changes in the left hip and left knee. Vascular calcifications. No evidence of acute fracture or subluxation. No focal bone lesion or bone destruction. Bone cortex and trabecular architecture appear intact. No radiopaque soft tissue foreign bodies.  IMPRESSION: No  acute bony abnormalities.   Electronically Signed   By: Lucienne Capers M.D.   On: 10/14/2014 02:31        Scheduled Meds: . allopurinol  300 mg Oral Daily  . amLODipine  10 mg Oral Daily  . atorvastatin  20 mg Oral QHS  . cloNIDine  0.2 mg Oral TID  . docusate sodium  100 mg Oral BID  . ferrous sulfate  325 mg Oral Q breakfast  . gabapentin  100 mg Oral BID  . hydrOXYzine  25 mg Oral BID  . insulin aspart  0-9 Units Subcutaneous TID WC  . insulin NPH Human  60 Units Subcutaneous QAC supper  . insulin NPH Human  70 Units Subcutaneous QAC breakfast  . loratadine  10 mg Oral Daily  . nystatin   Topical BID  . pantoprazole  40 mg Oral Daily  . pramipexole  1 mg Oral QHS  . sodium chloride  3 mL Intravenous Q12H  . tamsulosin  0.4 mg Oral Daily  . tiZANidine  2 mg Oral BID  . triamcinolone 0.1 % cream : eucerin  1 application Topical Daily  . warfarin  5 mg Oral ONCE-1800  . Warfarin - Pharmacist Dosing Inpatient   Does not apply q1800   Continuous Infusions: . sodium chloride 75 mL/hr at 10/15/14 1037    Active Problems:   Renal insufficiency   Acute renal failure    Time spent: 42 minutes    Batina Dougan, MD, FACP, FHM. [facility] Pager [phone]  If 7PM-7AM,  please contact night-coverage [email or web address] Password [email or web address] 10/15/2014, 1:33 PM    LOS: 1 day

## 2014-10-15 NOTE — Progress Notes (Signed)
Pt's O2 desated to 63% RA while sleep. Able to arouse pt, sats increased to 94-95%. Placed pt on 2L [place] and will continue to monitor.

## 2014-10-15 NOTE — Care Management Note (Signed)
Case Management Note  Patient Details  Name: Dale Henry MRN: [id number] Date of Birth: October 02, 1945  Subjective/Objective: spoke to 69 y/o m admitted w/renal insufficiency.Active w/[facility] [phone].HHRN/HHPT-will fax hhc orders once confirm fax #  Will need ambulance transp @ d/c.               Action/Plan:d/c home w/HHC   Expected Discharge Date:   (unknown)               Expected Discharge Plan:  [place]  In-House Referral:     Discharge planning Services  CM Consult  Post Acute Care Choice:  [place] (Active w/[facility]-HHPT) Choice offered to:  Patient  DME Arranged:    DME Agency:     HH Arranged:    [place]:  [place]  Status of Service:  In process, will continue to follow  Medicare Important Message Given:    Date Medicare IM Given:    Medicare IM give by:    Date Additional Medicare IM Given:    Additional Medicare Important Message give by:     If discussed at [place] of Stay Meetings, dates discussed:    Additional Comments:  [name], RN 10/15/2014, 1:12 PM

## 2014-10-15 NOTE — Progress Notes (Signed)
CBI weaned all night and stopped at 0440.

## 2014-10-16 DIAGNOSIS — G4733 Obstructive sleep apnea (adult) (pediatric): Secondary | ICD-10-CM

## 2014-10-16 DIAGNOSIS — S82202D Unspecified fracture of shaft of left tibia, subsequent encounter for closed fracture with routine healing: Secondary | ICD-10-CM

## 2014-10-16 LAB — BASIC METABOLIC PANEL
ANION GAP: 4 — AB (ref 5–15)
BUN: 33 mg/dL — ABNORMAL HIGH (ref 6–20)
CALCIUM: 8 mg/dL — AB (ref 8.9–10.3)
CO2: 24 mmol/L (ref 22–32)
Chloride: 112 mmol/L — ABNORMAL HIGH (ref 101–111)
Creatinine, Ser: 2.49 mg/dL — ABNORMAL HIGH (ref 0.61–1.24)
GFR, EST AFRICAN AMERICAN: 28 mL/min — AB (ref >60)
GFR, EST NON AFRICAN AMERICAN: 24 mL/min — AB (ref >60)
GLUCOSE: 80 mg/dL (ref 65–99)
POTASSIUM: 5.2 mmol/L — AB (ref 3.5–5.1)
Sodium: 140 mmol/L (ref 135–145)

## 2014-10-16 LAB — CBC
HEMATOCRIT: 27 % — AB (ref 39.0–52.0)
Hemoglobin: 8.6 g/dL — ABNORMAL LOW (ref 13.0–17.0)
MCH: 28.2 pg (ref 26.0–34.0)
MCHC: 31.9 g/dL (ref 30.0–36.0)
MCV: 88.5 fL (ref 78.0–100.0)
PLATELETS: 188 10*3/uL (ref 150–400)
RBC: 3.05 MIL/uL — AB (ref 4.22–5.81)
RDW: 16.5 % — ABNORMAL HIGH (ref 11.5–15.5)
WBC: 4.9 10*3/uL (ref 4.0–10.5)

## 2014-10-16 LAB — GLUCOSE, CAPILLARY
GLUCOSE-CAPILLARY: 102 mg/dL — AB (ref 65–99)
GLUCOSE-CAPILLARY: 94 mg/dL (ref 65–99)
GLUCOSE-CAPILLARY: 122 mg/dL — AB (ref 65–99)
Glucose-Capillary: 68 mg/dL (ref 65–99)
Glucose-Capillary: 114 mg/dL — ABNORMAL HIGH (ref 65–99)
Glucose-Capillary: 101 mg/dL — ABNORMAL HIGH (ref 65–99)
Glucose-Capillary: 95 mg/dL (ref 65–99)

## 2014-10-16 LAB — PROTIME-INR
INR: 2.23 — AB (ref 0.00–1.49)
Prothrombin Time: 24.5 seconds — ABNORMAL HIGH (ref 11.6–15.2)

## 2014-10-16 MED ORDER — INSULIN NPH (HUMAN) (ISOPHANE) 100 UNIT/ML ~~LOC~~ SUSP
45.0000 [IU] | Freq: Every day | SUBCUTANEOUS | Status: DC
  Administered 2014-10-16: 45 [IU] via SUBCUTANEOUS

## 2014-10-16 MED ORDER — INSULIN NPH (HUMAN) (ISOPHANE) 100 UNIT/ML ~~LOC~~ SUSP
55.0000 [IU] | Freq: Every day | SUBCUTANEOUS | Status: DC
  Administered 2014-10-16 – 2014-10-18 (×3): 55 [IU] via SUBCUTANEOUS
  Filled 2014-10-16: qty 10

## 2014-10-16 MED ORDER — WARFARIN SODIUM 5 MG PO TABS
5.0000 mg | ORAL_TABLET | Freq: Once | ORAL | Status: AC
  Administered 2014-10-16: 5 mg via ORAL
  Filled 2014-10-16: qty 1

## 2014-10-16 MED ORDER — SODIUM CHLORIDE 0.9 % IV SOLN
INTRAVENOUS | Status: AC
  Administered 2014-10-16 – 2014-10-17 (×3): via INTRAVENOUS

## 2014-10-16 MED ORDER — INFLUENZA VAC SPLIT QUAD 0.5 ML IM SUSY: 0.5000 mL | PREFILLED_SYRINGE | INTRAMUSCULAR | Status: DC | PRN

## 2014-10-16 MED ORDER — SODIUM POLYSTYRENE SULFONATE 15 GM/60ML PO SUSP
60.0000 g | Freq: Once | ORAL | Status: AC
  Administered 2014-10-16: 60 g via ORAL
  Filled 2014-10-16: qty 240

## 2014-10-16 NOTE — Progress Notes (Signed)
PROGRESS NOTE    Dale Henry ZOX:[id number] DOB: 1945/04/01 DOA: 10/13/2014 PCP: PROVIDER NOT IN SYSTEM  HPI/Brief narrative 69 year old male patient, married, lives with spouse, ambulates with the help of a walker, PMH of DM 2, stage III chronic kidney disease, HTN, GERD, dermatitis/stasis dermatitis, gout, HLD, iron deficiency anemia, lymphedema of abdominal panniculus and legs, morbid obesity, PAD on Coumadin and TIA presented to the [facility] ED via EMS on 10/14/14 secondary to left knee pain that developed suddenly when he was climbing up stairs. This was associated with a popping sound in the knee. He was unable to bear weight. In ED, patient noted to be hemodynamically stable except initial oxygen saturation 88% on room air. Blood work notable for Hg 9.2, K 5.9, CO2 18, BUN 45, Cr 3.06. Imaging studies included CT of the left knee w/o contrast and notable for nondisplaced fracture of the posterior tibia at the PCL insertion with moderate joint effusion. Orthopedic team was consulted for further assistance, TRH asked to admit for further evaluation.   Assessment/Plan:  Active Problems: Nondisplaced left posterior tibial fracture, findings radiographically consistent with PCL avulsion - Orthopedics consulted on 8/25 and recommended: Due to significant challenges his weight poses on any functional activity, will not restrict him other than his own pain. So, WBAT on LLE and PT evaluation - No orthopedic follow-up - Pain decreased. Work with PT on 8/26 >recommend SNF.  Acute on stage III chronic kidney disease - Reviewed the records from outpatient PCP. Creatinine of 2.6 and potassium 6.1 on 09/27/14 - Admitted with creatinine off 3.06 and potassium of 5.9 suggesting acute on chronic kidney disease - Acute renal failure may be precipitated by lisinopril and some degree of dehydration. - Renal ultrasound: Right kidney without hydronephrosis. Left kidney not visualized. -  Briefly hydrate with IV fluids and follow BMP. Patient has Foley catheter. - FeNA 6.8 ? ATN - Improving creatinine. Nonoliguric.  Acute hypoxic respiratory failure/OSA - Patient has underlying undiagnosed OHS/OSA. On 8/27, he stated that he did have outpatient sleep study couple years ago and was told to have sleep apnea but no further interventions done - Overnight 8/26, desaturated to 63% on room air while asleep. Will place patient on nightly CPAP and when necessary during sleep in the day on auto titrate mode. Will need outpatient repeat of sleep study and management. - Chest x-ray: No acute findings. - Oxygen supplementation.  Hyperkalemia with metabolic acidosis (mild, NAG) - Secondary to acute renal failure and ACEI.? RTA 4.  - Continue treating acute kidney injury with IV fluids. - Status post couple of doses of Kayexalate on 8/25 & a dose on 8/26. Potassium has improved from 5.9-5.2. We will repeat a dose of Kayexalate 60 g 1 - Follow BMP in a.m.  Anemia of chronic disease/iron deficiency/chronic kidney disease - Hemoglobin outpatient on 09/27/14 was 9.6. - Slight drop in hemoglobin to 8.6. Follow CBC in a.m.  Essential hypertension - Continue Norvasc and clonidine the patient takes at home - Hold lisinopril as noted above  - Add hydralazine as needed  - Reasonable inpatient control.  Hyperlipidemia - continue statin per home medical regimen  - Lipid panel unremarkable except for HDL of 34.  Uncontrolled DM 2 with chronic kidney disease/hypoglycemia (8/27) - Patient on high-dose insulin regimen at home. Has good appetite. - Add sliding scale insulin  - Continue gabapentin for neuropathies  - A1c on 09/27/14:6.7. - We'll reduce both a.m. and p.m. doses of long-acting insulin and  monitor closely.   On Anticoagulation  - As per PCP notes, on anticoagulation for PAD - Continue Coumadin per pharmacy and defer further management to outpatient follow-up with PCP.  Morbid  obesity - Body mass index is 70.55 kg/(m^2).  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 Was supposed to see podiatrist as outpatient. - No acute findings.   DVT prophylaxis: Anticoagulated on Coumadin Code Status: Full Family Communication: Discussed with spouse at bedside on 8/26 Disposition Plan:  DC to SNF possibly early next week   Consultants:  Orthopedics  Procedures:  Indwelling Foley catheter-For now continue while monitoring intake and output related to acute renal insufficiency. DC in the next 1-2 days.  Antibiotics:  None   Subjective: Left knee pain is better. States that he was able to stand up and take a couple of steps with assistance with PT yesterday. Had BM. No dyspnea.   Objective: Filed Vitals:   10/15/14 1346 10/15/14 2039 10/16/14 0436 10/16/14 0820  BP: 167/71 131/53 144/68 153/65  Pulse: 91 77 74 69  Temp: 98.3 F (36.8 C) 97.8 F (36.6 C) 97.7 F (36.5 C) 97.7 F (36.5 C)  TempSrc: Oral Oral Oral Oral  Resp: 19 19 20 20   Height:      Weight:   193.232 kg (426 lb)   SpO2: 93% 94% 93% 97%    Intake/Output Summary (Last 24 hours) at 10/16/14 1154 Last data filed at 10/16/14 7824  Gross per 24 hour  Intake 2533.75 ml  Output   2400 ml  Net 133.75 ml   Filed Weights   10/14/14 0852 10/15/14 0437 10/16/14 0436  Weight: 192.3 kg (423 lb 15.1 oz) 190.057 kg (419 lb) 193.232 kg (426 lb)     Exam:  General exam: Moderately built and morbidly obese pleasant elderly male lying comfortably supine in bed. Respiratory system: Clear. No increased work of breathing. Cardiovascular system: S1 & S2 heard, RRR. No JVD, murmurs, gallops, clicks. Telemetry sinus rhythm with wide QRS.  Gastrointestinal system: Abdomen is protuberant, soft and nontender. Normal bowel sounds heard.  Central nervous system: Alert and oriented. No focal neurological deficits. Extremities: Symmetric 5 x 5  power. Skin: Chronic skin changes of stasis dermatitis/lymphedema of abdominal panniculus and bilateral legs without acute findings.    Data Reviewed: Basic Metabolic Panel:  Recent Labs Lab 10/14/14 0237 10/14/14 0646 10/14/14 1325 10/14/14 2230 10/15/14 0509 10/16/14 0545  NA 139 140 141 140 140 140  K 5.9* 6.1* 6.0* 5.8* 5.3* 5.2*  CL 113* 114* 114* 114* 113* 112*  CO2 18*  --  19* 21* 19* 24  GLUCOSE 92 91 135* 171* 134* 80  BUN 45* 40* 40* 40* 39* 33*  CREATININE 3.06* 2.90* 2.89* 2.82* 2.74* 2.49*  CALCIUM 8.4*  --  8.4* 8.1* 8.1* 8.0*   Liver Function Tests: No results for input(s): AST, ALT, ALKPHOS, BILITOT, PROT, ALBUMIN in the last 168 hours. No results for input(s): LIPASE, AMYLASE in the last 168 hours. No results for input(s): AMMONIA in the last 168 hours. CBC:  Recent Labs Lab 10/14/14 0237 10/14/14 0646 10/15/14 0509 10/16/14 0545  WBC 6.4  --  5.9 4.9  NEUTROABS 4.4  --   --   --   HGB 9.2* 9.5* 9.0* 8.6*  HCT 29.7* 28.0* 28.1* 27.0*  MCV 87.6  --  88.4 88.5  PLT 181  --  195 188   Cardiac Enzymes: No results for input(s):  CKTOTAL, CKMB, CKMBINDEX, TROPONINI in the last 168 hours. BNP (last 3 results) No results for input(s): PROBNP in the last 8760 hours. CBG:  Recent Labs Lab 10/15/14 2157 10/16/14 0749 10/16/14 0835 10/16/14 0922 10/16/14 1027  GLUCAP 127* 68 94 102* 122*    No results found for this or any previous visit (from the past 240 hour(s)).      Studies: Dg Chest 2 View  10/14/2014   CLINICAL DATA:  Hypoxia  EXAM: CHEST - 2 VIEW  COMPARISON:  02/11/2014  FINDINGS: Cardiac shadow remains enlarged. The lungs are well aerated bilaterally. Pleural thickening is noted laterally on the right stable from the prior exam. No acute bony abnormality is seen.  IMPRESSION: No active disease.   Electronically Signed   By: Inez Catalina M.D.   On: 10/14/2014 13:27        Scheduled Meds: . allopurinol  300 mg Oral Daily  .  amLODipine  10 mg Oral Daily  . atorvastatin  20 mg Oral QHS  . cloNIDine  0.2 mg Oral TID  . docusate sodium  100 mg Oral BID  . ferrous sulfate  325 mg Oral Q breakfast  . gabapentin  100 mg Oral BID  . hydrOXYzine  25 mg Oral BID  . insulin aspart  0-9 Units Subcutaneous TID WC  . insulin NPH Human  45 Units Subcutaneous QAC supper  . [START ON 10/17/2014] insulin NPH Human  55 Units Subcutaneous QAC breakfast  . loratadine  10 mg Oral Daily  . nystatin   Topical BID  . pantoprazole  40 mg Oral Daily  . pramipexole  1 mg Oral QHS  . sodium chloride  3 mL Intravenous Q12H  . tamsulosin  0.4 mg Oral Daily  . tiZANidine  2 mg Oral BID  . triamcinolone 0.1 % cream : eucerin  1 application Topical Daily  . warfarin  5 mg Oral ONCE-1800  . Warfarin - Pharmacist Dosing Inpatient   Does not apply q1800   Continuous Infusions: . sodium chloride 75 mL/hr at 10/16/14 0809    Active Problems:   Renal insufficiency   Acute renal failure    Time spent: 57 minutes    Taje Littler, MD, FACP, FHM. [facility] Pager [phone]  If 7PM-7AM, please contact night-coverage [email or web address] Password [email or web address] 10/16/2014, 11:54 AM    LOS: 2 days

## 2014-10-16 NOTE — Progress Notes (Signed)
Placed patient on CPAP Auto Mode with 2L bled in. Patient is tolerating it well and RT will continue to monitor.

## 2014-10-16 NOTE — Progress Notes (Signed)
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 Codeine Itching  . Shellfish Allergy Nausea Only    Patient Measurements: Height: 5\' 5"  (165.1 cm) Weight: (!) 426 lb (193.232 kg) IBW/kg (Calculated) : 61.5  Vital Signs: Temp: 97.7 F (36.5 C) (08/27 0436) Temp Source: Oral (08/27 0436) BP: 144/68 mmHg (08/27 0436) Pulse Rate: 74 (08/27 0436)  Labs:  Recent Labs  10/14/14 0237 10/14/14 1937 10/14/14 1210  10/14/14 2230 10/15/14 0509 10/16/14 0545  HGB 9.2* 9.5*  --   --   --  9.0* 8.6*  HCT 29.7* 28.0*  --   --   --  28.1* 27.0*  PLT 181  --   --   --   --  195 188  LABPROT  --   --  25.0*  --   --  24.9* 24.5*  INR  --   --  2.29*  --   --  2.28* 2.23*  CREATININE 3.06* 2.90*  --   < > 2.82* 2.74* 2.49*  < > = values in this interval not displayed.  Estimated Creatinine Clearance: 44 mL/min (by C-G formula based on Cr of 2.49).   Medical History: Past Medical History  Diagnosis Date  . Morbid obesity   . Diabetes mellitus without complication     Medications:  Prescriptions prior to admission  Medication Sig Dispense Refill Last Dose  . allopurinol (ZYLOPRIM) 300 MG tablet Take 300 mg by mouth daily.   10/13/2014 at Unknown time  . amLODipine (NORVASC) 10 MG tablet Take 10 mg by mouth daily.   10/13/2014 at Unknown time  . atorvastatin (LIPITOR) 20 MG tablet Take 20 mg by mouth at bedtime.   10/12/2014 at Unknown time  . cloNIDine (CATAPRES) 0.2 MG tablet Take 0.2 mg by mouth 3 (three) times daily.   10/13/2014 at 1400  . diphenhydrAMINE (BENADRYL) 25 MG tablet Take 25 mg by mouth every 6 (six) hours as needed for itching.   10/12/2014 at Unknown time  . docusate sodium (COLACE) 100 MG capsule Take 100 mg by mouth 2 (two) times daily.   10/13/2014 at Unknown time  . ferrous sulfate 325 (65 FE) MG tablet Take 325 mg by mouth daily with breakfast.   10/13/2014 at Unknown time  .  gabapentin (NEURONTIN) 100 MG capsule Take 100 mg by mouth 2 (two) times daily.   10/13/2014 at Unknown time  . hydrOXYzine (ATARAX/VISTARIL) 25 MG tablet Take 25 mg by mouth 2 (two) times daily.   10/13/2014 at Unknown time  . insulin NPH Human (HUMULIN N,NOVOLIN N) 100 UNIT/ML injection Inject 60-70 Units into the skin 2 (two) times daily before a meal. Take 70 units every morning and Take 60 units every evening.   10/13/2014 at Unknown time  . insulin regular (NOVOLIN R,HUMULIN R) 100 units/mL injection Inject 3 Units into the skin 3 (three) times daily as needed for high blood sugar.   10/13/2014 at Unknown time  . levocetirizine (XYZAL) 5 MG tablet Take 5 mg by mouth every evening.   10/12/2014 at Unknown time  . lisinopril (PRINIVIL,ZESTRIL) 40 MG tablet Take 40 mg by mouth 2 (two) times daily.   10/13/2014 at Unknown time  . Nystatin (NYAMYC) 100000 UNIT/GM POWD Apply 1 g topically 2 (two) times daily.   4 10/13/2014 at Unknown time  . omeprazole (PRILOSEC) 20 MG capsule Take 20 mg by mouth daily.   10/13/2014 at Unknown time  .  pramipexole (MIRAPEX) 1 MG tablet Take 1 mg by mouth at bedtime.   10/12/2014 at Unknown time  . Probiotic CAPS Take 1 capsule by mouth daily.   10/13/2014 at Unknown time  . SSD 1 % cream Apply 1 application topically daily as needed (sores).   0 Past Month at Unknown time  . tamsulosin (FLOMAX) 0.4 MG CAPS capsule Take 0.4 mg by mouth daily.   10/13/2014 at Unknown time  . tiZANidine (ZANAFLEX) 2 MG tablet Take 2 mg by mouth 2 (two) times daily.   10/13/2014 at Unknown time  . traMADol (ULTRAM) 50 MG tablet Take 50 mg by mouth every 6 (six) hours as needed for moderate pain or severe pain.   10/12/2014 at Unknown time  . Triamcinolone Acetonide (TRIAMCINOLONE 0.1 % CREAM : EUCERIN) CREA Apply 1 application topically daily.   10/13/2014 at Unknown time  . warfarin (COUMADIN) 10 MG tablet Take 5 mg by mouth daily.   10/12/2014 at 1800    Assessment: 69 yo obese M presented with  knee pain, fracture.  He is on chronic Coumadin 5mg  daily.  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  He is followed by a Coumadin clinic in [facility] and most recently has had home health nurse to come draw his INR.  He states he has been on 5mg  daily for quite a while.   10/16/2014:   INR remains therapeutic (2.23)  Hgb decreased slightly  No bleeding reported  Tolerating regular diet  No drug interactions  Goal of Therapy:  INR 2-3   Plan:   Repeat warfarin 5mg  po x1 today  Daily INR  Peggyann Juba, PharmD, BCPS Pager: [phone] 10/16/2014,7:55 AM

## 2014-10-16 NOTE — Progress Notes (Signed)
Hypoglycemic Event  CBG:68  Treatment: orange Juice 4 oz  Symptoms: asymptomatic  Follow-up CBG: Time: 1848 CBG Result : 94  Possible Reasons for Event: unsure  Comments/MD notified:Dr. Lindaann Pascal   Skylier Kretschmer A Mary-Ann Pennella  Remember to initiate Hypoglycemia Order Set & complete

## 2014-10-17 LAB — BASIC METABOLIC PANEL
ANION GAP: 6 (ref 5–15)
BUN: 33 mg/dL — AB (ref 6–20)
CALCIUM: 8.1 mg/dL — AB (ref 8.9–10.3)
CO2: 21 mmol/L — ABNORMAL LOW (ref 22–32)
Chloride: 114 mmol/L — ABNORMAL HIGH (ref 101–111)
Creatinine, Ser: 2.25 mg/dL — ABNORMAL HIGH (ref 0.61–1.24)
GFR calc Af Amer: 32 mL/min — ABNORMAL LOW (ref >60)
GFR, EST NON AFRICAN AMERICAN: 28 mL/min — AB (ref >60)
GLUCOSE: 93 mg/dL (ref 65–99)
Potassium: 4.6 mmol/L (ref 3.5–5.1)
SODIUM: 141 mmol/L (ref 135–145)

## 2014-10-17 LAB — GLUCOSE, CAPILLARY
GLUCOSE-CAPILLARY: 81 mg/dL (ref 65–99)
Glucose-Capillary: 143 mg/dL — ABNORMAL HIGH (ref 65–99)
Glucose-Capillary: 126 mg/dL — ABNORMAL HIGH (ref 65–99)
Glucose-Capillary: 94 mg/dL (ref 65–99)

## 2014-10-17 LAB — PROTIME-INR
INR: 2.13 — AB (ref 0.00–1.49)
PROTHROMBIN TIME: 23.6 s — AB (ref 11.6–15.2)

## 2014-10-17 LAB — CBC
HCT: 28.2 % — ABNORMAL LOW (ref 39.0–52.0)
Hemoglobin: 9 g/dL — ABNORMAL LOW (ref 13.0–17.0)
MCH: 28.2 pg (ref 26.0–34.0)
MCHC: 31.9 g/dL (ref 30.0–36.0)
MCV: 88.4 fL (ref 78.0–100.0)
Platelets: 191 10*3/uL (ref 150–400)
RBC: 3.19 MIL/uL — ABNORMAL LOW (ref 4.22–5.81)
RDW: 16.4 % — AB (ref 11.5–15.5)
WBC: 5.3 10*3/uL (ref 4.0–10.5)

## 2014-10-17 MED ORDER — WARFARIN SODIUM 6 MG PO TABS
6.0000 mg | ORAL_TABLET | Freq: Once | ORAL | Status: AC
  Administered 2014-10-17: 6 mg via ORAL
  Filled 2014-10-17: qty 1

## 2014-10-17 MED ORDER — INSULIN NPH (HUMAN) (ISOPHANE) 100 UNIT/ML ~~LOC~~ SUSP
35.0000 [IU] | Freq: Every day | SUBCUTANEOUS | Status: DC
  Administered 2014-10-17 – 2014-10-18 (×2): 35 [IU] via SUBCUTANEOUS

## 2014-10-17 NOTE — Clinical Social Work Note (Signed)
Clinical Social Work Assessment  Patient Details  Name: Dale Henry MRN: [id number] Date of Birth: [birth year]  Date of referral:  10/17/14               Reason for consult:  Facility Placement                Permission sought to share information with:  Family Supports, Chartered certified accountant granted to share information::  Yes, Verbal Permission Granted  Name::        Agency::     Relationship::     Contact Information:     Housing/Transportation Living arrangements for the past 2 months:  Single Family Home Source of Information:  Spouse Patient Interpreter Needed:    Criminal Activity/Legal Involvement Pertinent to Current Situation/Hospitalization:    Significant Relationships:  Spouse Lives with:    Do you feel safe going back to the place where you live?    Need for family participation in patient care:     Care giving concerns:  Pt's wife is concerned that she will not be able to pick pt up from the floor so she has changed her mind about home health and wants SNF   Social Worker assessment / plan:  CSW will send clinicals and message to [facility] SNF advising them that pt has chosen them for his rehab needs.  In home with Janeece Riggers has been set up and will need to be cancelled with case manager  Employment status:  Retired Forensic scientist:  Managed Care PT Recommendations:  [place] / Referral to community resources:     Patient/Family's Response to care:  Pt's wife [facility] but is now concerned that she will not be able to pick pt up if he falls.  Pt 's wife stated that both she and pt had a good experience at [facility] when pt did his last rehab so they want to go to this SNF again at discharge  Patient/Family's Understanding of and Emotional Response to Diagnosis, Current Treatment, and Prognosis:  Pt's spouse appears to understand pt's diagnoses and is hoping for rehab to help  pt gain his strength back  Emotional Assessment Appearance:    Attitude/Demeanor/Rapport:   (unable to assess) Affect (typically observed):   (unable to assess) Orientation:  Oriented to Self, Oriented to Place, Oriented to  Time, Oriented to Situation Alcohol / Substance use:    Psych involvement (Current and /or in the community):     Discharge Needs  Concerns to be addressed:    Readmission within the last 30 days:    Current discharge risk:    Barriers to Discharge:  No Barriers Identified   Carlean Jews, LCSW 10/17/2014, 1:59 PM

## 2014-10-17 NOTE — Care Management Important Message (Signed)
Important Message  Patient Details  Name: Dale Henry MRN: [id number] Date of Birth: 02-Apr-1945   Medicare Important Message Given:  Yes-second notification given    Apolonio Schneiders, RN 10/17/2014, 12:04 PM

## 2014-10-17 NOTE — Progress Notes (Signed)
Occupational Therapy Evaluation Patient Details Name: Dale Henry MRN: [id number] DOB: April 21, 1945 Today's Date: 10/17/2014    History of Present Illness 69 yo male, morbidly obese, with hx HTN, HLD, DM, on Coumadin presented to [facility] ED via EMS for evaluation of left knee pain with imaging revealing Small nondisplaced fracture of the posterior tibia at the PCL   Clinical Impression   Patient sleepy during OT evaluation; educated patient and wife on OT plan of care. Presents with decreased ADL independence and safety. OT will follow to maximize independence and to facilitate a safe discharge plan.    Follow Up Recommendations  SNF;Supervision/Assistance - 24 hour    Equipment Recommendations  3 in 1 bedside comode -- wide. Patient reports he has one at home but it is not wide enough.   Recommendations for Other Services       Precautions / Restrictions Precautions Precautions: Fall Restrictions Weight Bearing Restrictions: Yes LLE Weight Bearing: Weight bearing as tolerated      Mobility Bed Mobility                  Transfers                      Balance                                            ADL Overall ADL's : Needs assistance/impaired Eating/Feeding: Set up;Bed level   Grooming: Minimal assistance;Brushing hair;Wash/dry hands;Wash/dry face;Bed level                               Functional mobility during ADLs: +2 for physical assistance;+2 for safety/equipment General ADL Comments: Patient sleepy during evaluation. Was able to perform bed level grooming activities. His wife was present. At home, he was getting to [facility] on his own but needed assistance with toilet hygiene and clothing management. Did not feel like practicing today. Also reports the [facility] at home is not wide enough for him.     Vision     Perception     Praxis      Pertinent Vitals/Pain Pain Assessment: No/denies pain     Hand Dominance  Right   Extremity/Trunk Assessment Upper Extremity Assessment Upper Extremity Assessment: RUE deficits/detail RUE Deficits / Details: reports hx of R rotator cuff tear with limited strength and motion   Lower Extremity Assessment Lower Extremity Assessment: Defer to PT evaluation       Communication Communication Communication: HOH   Cognition Arousal/Alertness: Awake/alert Behavior During Therapy: WFL for tasks assessed/performed Overall Cognitive Status: Within Functional Limits for tasks assessed                     General Comments       Exercises       Shoulder Instructions      Home Living Family/patient expects to be discharged to:: Private residence Living Arrangements: Spouse/significant other Available Help at Discharge: Available 24 hours/day Type of Home: House Home Access: Stairs to enter [employer] of Steps: 3 Entrance Stairs-Rails: Right Home Layout: One level     Bathroom Shower/Tub: Other (comment) (does not use bathroom at home. Wife gives him bed baths.)   Bathroom Toilet: Standard (only uses BSC next to bed/chair. Does not go into bathroom) Bathroom Accessibility: No  Home Equipment: Bedside commode;Electric scooter;Hospital bed;Walker - 2 wheels   Additional Comments: reports hospital bed does not raise from floor enough to assist him with transfers; wife reports BSC not wide enough for him      Prior Functioning/Environment Level of Independence: Needs assistance  Gait / Transfers Assistance Needed: typically short distance ambulator only with RW, has scooter for mobility  ADL's / Homemaking Assistance Needed: requires assist for bathing and dressing from spouse        OT Diagnosis: Generalized weakness   OT Problem List: Decreased strength;Decreased activity tolerance;Decreased knowledge of use of DME or AE;Pain;Impaired UE functional use;Increased edema   OT Treatment/Interventions: Self-care/ADL  training;Therapeutic exercise;DME and/or AE instruction;Therapeutic activities;Patient/family education    OT Goals(Current goals can be found in the care plan section) Acute Rehab OT Goals OT Goal Formulation: With patient Time For Goal Achievement: 10/31/14 Potential to Achieve Goals: Fair  OT Frequency: Min 2X/week   Barriers to D/C:            Co-evaluation              End of Session    Activity Tolerance: Patient limited by fatigue Patient left: in bed;with call bell/phone within reach;with family/visitor present   Time: 2957-4734 OT Time Calculation (min): 15 min Charges:  OT General Charges $OT Visit: 1 Procedure OT Evaluation $Initial OT Evaluation Tier I: 1 Procedure G-Codes:    Manda Holstad A November 10, 2014, 12:09 PM

## 2014-10-17 NOTE — Progress Notes (Signed)
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 Codeine Itching  . Shellfish Allergy Nausea Only    Patient Measurements: Height: 5\' 5"  (165.1 cm) Weight: (!) 406 lb (184.16 kg) IBW/kg (Calculated) : 61.5  Vital Signs: Temp: 98.2 F (36.8 C) (08/28 1000) Temp Source: Oral (08/28 1000) BP: 144/60 mmHg (08/28 1000) Pulse Rate: 72 (08/28 1000)  Labs:  Recent Labs  10/15/14 0509 10/16/14 0545 10/17/14 0613  HGB 9.0* 8.6* 9.0*  HCT 28.1* 27.0* 28.2*  PLT 195 188 191  LABPROT 24.9* 24.5* 23.6*  INR 2.28* 2.23* 2.13*  CREATININE 2.74* 2.49* 2.25*    Estimated Creatinine Clearance: 47.1 mL/min (by C-G formula based on Cr of 2.25).   Medical History: Past Medical History  Diagnosis Date  . Morbid obesity   . Diabetes mellitus without complication     Medications:  Prescriptions prior to admission  Medication Sig Dispense Refill Last Dose  . allopurinol (ZYLOPRIM) 300 MG tablet Take 300 mg by mouth daily.   10/13/2014 at Unknown time  . amLODipine (NORVASC) 10 MG tablet Take 10 mg by mouth daily.   10/13/2014 at Unknown time  . atorvastatin (LIPITOR) 20 MG tablet Take 20 mg by mouth at bedtime.   10/12/2014 at Unknown time  . cloNIDine (CATAPRES) 0.2 MG tablet Take 0.2 mg by mouth 3 (three) times daily.   10/13/2014 at 1400  . diphenhydrAMINE (BENADRYL) 25 MG tablet Take 25 mg by mouth every 6 (six) hours as needed for itching.   10/12/2014 at Unknown time  . docusate sodium (COLACE) 100 MG capsule Take 100 mg by mouth 2 (two) times daily.   10/13/2014 at Unknown time  . ferrous sulfate 325 (65 FE) MG tablet Take 325 mg by mouth daily with breakfast.   10/13/2014 at Unknown time  . gabapentin (NEURONTIN) 100 MG capsule Take 100 mg by mouth 2 (two) times daily.   10/13/2014 at Unknown time  . hydrOXYzine (ATARAX/VISTARIL) 25 MG tablet Take 25 mg by mouth 2 (two) times daily.   10/13/2014 at  Unknown time  . insulin NPH Human (HUMULIN N,NOVOLIN N) 100 UNIT/ML injection Inject 60-70 Units into the skin 2 (two) times daily before a meal. Take 70 units every morning and Take 60 units every evening.   10/13/2014 at Unknown time  . insulin regular (NOVOLIN R,HUMULIN R) 100 units/mL injection Inject 3 Units into the skin 3 (three) times daily as needed for high blood sugar.   10/13/2014 at Unknown time  . levocetirizine (XYZAL) 5 MG tablet Take 5 mg by mouth every evening.   10/12/2014 at Unknown time  . lisinopril (PRINIVIL,ZESTRIL) 40 MG tablet Take 40 mg by mouth 2 (two) times daily.   10/13/2014 at Unknown time  . Nystatin (NYAMYC) 100000 UNIT/GM POWD Apply 1 g topically 2 (two) times daily.   4 10/13/2014 at Unknown time  . omeprazole (PRILOSEC) 20 MG capsule Take 20 mg by mouth daily.   10/13/2014 at Unknown time  . pramipexole (MIRAPEX) 1 MG tablet Take 1 mg by mouth at bedtime.   10/12/2014 at Unknown time  . Probiotic CAPS Take 1 capsule by mouth daily.   10/13/2014 at Unknown time  . SSD 1 % cream Apply 1 application topically daily as needed (sores).   0 Past Month at Unknown time  . tamsulosin (FLOMAX) 0.4 MG CAPS capsule Take 0.4 mg by mouth daily.   10/13/2014 at Unknown time  .  tiZANidine (ZANAFLEX) 2 MG tablet Take 2 mg by mouth 2 (two) times daily.   10/13/2014 at Unknown time  . traMADol (ULTRAM) 50 MG tablet Take 50 mg by mouth every 6 (six) hours as needed for moderate pain or severe pain.   10/12/2014 at Unknown time  . Triamcinolone Acetonide (TRIAMCINOLONE 0.1 % CREAM : EUCERIN) CREA Apply 1 application topically daily.   10/13/2014 at Unknown time  . warfarin (COUMADIN) 10 MG tablet Take 5 mg by mouth daily.   10/12/2014 at 1800    Assessment: 69 yo obese M presented with knee pain, fracture.  He is on chronic Coumadin 5mg  daily.  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  He is followed by a Coumadin clinic in [facility] and most recently  has had home health nurse to come draw his INR.  He states he has been on 5mg  daily for quite a while.   10/17/2014:   INR remains therapeutic (2.13) but trending down  Hgb low but stable  No bleeding reported  Tolerating regular diet  No drug interactions  Goal of Therapy:  INR 2-3   Plan:   Increase warfarin to 6mg  po x1 today to avoid further decrease  Daily INR  Peggyann Juba, PharmD, BCPS Pager: [phone] 10/17/2014,10:45 AM

## 2014-10-17 NOTE — Progress Notes (Signed)
PROGRESS NOTE    Dale Henry UXL:[id number] DOB: 03/25/1945 DOA: 10/13/2014 PCP: PROVIDER NOT IN SYSTEM  HPI/Brief narrative 69 year old male patient, married, lives with spouse, ambulates with the help of a walker, PMH of DM 2, stage III chronic kidney disease, HTN, GERD, dermatitis/stasis dermatitis, gout, HLD, iron deficiency anemia, lymphedema of abdominal panniculus and legs, morbid obesity, PAD on Coumadin and TIA presented to the [facility] ED via EMS on 10/14/14 secondary to left knee pain that developed suddenly when he was climbing up stairs. This was associated with a popping sound in the knee. He was unable to bear weight. In ED, patient noted to be hemodynamically stable except initial oxygen saturation 88% on room air. Blood work notable for Hg 9.2, K 5.9, CO2 18, BUN 45, Cr 3.06. Imaging studies included CT of the left knee w/o contrast and notable for nondisplaced fracture of the posterior tibia at the PCL insertion with moderate joint effusion. Orthopedic team was consulted for further assistance, TRH asked to admit for further evaluation.   Assessment/Plan:  Active Problems: Nondisplaced left posterior tibial fracture, findings radiographically consistent with PCL avulsion - Orthopedics consulted on 8/25 and recommended: Due to significant challenges his weight poses on any functional activity, will not restrict him other than his own pain. So, WBAT on LLE and PT evaluation - No orthopedic follow-up - Pain decreased. Work with PT on 8/26 >recommend SNF. Requested nursing to contact CSW for SNF- DC when available.  Acute on stage III chronic kidney disease - Reviewed the records from outpatient PCP. Creatinine of 2.6 and potassium 6.1 on 09/27/14 - Admitted with creatinine off 3.06 and potassium of 5.9 suggesting acute on chronic kidney disease - Acute renal failure may be precipitated by lisinopril and some degree of dehydration. - Renal ultrasound: Right kidney  without hydronephrosis. Left kidney not visualized. - Briefly hydrate with IV fluids and follow BMP. Patient has Foley catheter. - FeNA 6.8 ? ATN - Creatinine continues to improve. Nonoliguric. - DC IV fluids and monitor off IVF.  Acute hypoxic respiratory failure/OSA - Patient has underlying undiagnosed OHS/OSA. On 8/27, he stated that he did have outpatient sleep study couple years ago and was told to have sleep apnea but no further interventions done - Overnight 8/26, desaturated to 63% on room air while asleep. Will place patient on nightly CPAP and when necessary during sleep in the day on auto titrate mode. Will need outpatient repeat of sleep study and management. - Chest x-ray: No acute findings. - Oxygen supplementation.  Hyperkalemia with metabolic acidosis (mild, NAG) - Secondary to acute renal failure and ACEI.? RTA 4.  - Continue treating acute kidney injury with IV fluids. - Status post couple of doses of Kayexalate on 8/25 & a dose on 8/26. Potassium has improved from 5.9-5.2. We will repeat a dose of Kayexalate 60 g 1 - Resolved  Anemia of chronic disease/iron deficiency/chronic kidney disease - Hemoglobin outpatient on 09/27/14 was 9.6. - Stable  Essential hypertension - Continue Norvasc and clonidine the patient takes at home - Hold lisinopril as noted above  - Add hydralazine as needed  - Reasonable inpatient control.  Hyperlipidemia - continue statin per home medical regimen  - Lipid panel unremarkable except for HDL of 34.  Uncontrolled DM 2 with chronic kidney disease/hypoglycemia (8/27) - Patient on high-dose insulin regimen at home. Has good appetite. - Add sliding scale insulin  - Continue gabapentin for neuropathies  - A1c on 09/27/14:6.7. - reduced both a.m.  and p.m. doses of long-acting insulin on 8/27 and monitor closely. FBG 81 > will reduce night N insulin.  On Anticoagulation  - As per PCP notes, on anticoagulation for PAD - Continue Coumadin  per pharmacy and defer further management to outpatient follow-up with PCP.  Morbid obesity - Body mass index is 70.55 kg/(m^2).  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 Was supposed to see podiatrist as outpatient. - No acute findings.   DVT prophylaxis: Anticoagulated on Coumadin Code Status: Full Family Communication: None at bedside Disposition Plan:  DC to SNF when bed available   Consultants:  Orthopedics  Procedures:  Indwelling Foley catheter - DC 8/28  Antibiotics:  None   Subjective: Denies complaints. States did not get BIPAP last night- d/w RN  Objective: Filed Vitals:   10/16/14 2216 10/17/14 0530 10/17/14 0549 10/17/14 1000  BP: 147/66 133/51  144/60  Pulse: 81 68  72  Temp: 98.7 F (37.1 C) 97.9 F (36.6 C)  98.2 F (36.8 C)  TempSrc: Oral Oral  Oral  Resp: 12 12  14   Height:      Weight:   184.16 kg (406 lb)   SpO2: 94% 94%  95%    Intake/Output Summary (Last 24 hours) at 10/17/14 1120 Last data filed at 10/17/14 9892  Gross per 24 hour  Intake   2055 ml  Output   3410 ml  Net  -1355 ml   Filed Weights   10/15/14 0437 10/16/14 0436 10/17/14 0549  Weight: 190.057 kg (419 lb) 193.232 kg (426 lb) 184.16 kg (406 lb)     Exam:  General exam: Moderately built and morbidly obese pleasant elderly male sitting up in bed eating breakfast this AM. Respiratory system: Clear. No increased work of breathing. Cardiovascular system: S1 & S2 heard, RRR. No JVD, murmurs, gallops, clicks. Telemetry sinus rhythm with wide QRS.  Gastrointestinal system: Abdomen is protuberant, soft and nontender. Normal bowel sounds heard.  Central nervous system: Alert and oriented. No focal neurological deficits. Extremities: Symmetric 5 x 5 power. Skin: Chronic skin changes of stasis dermatitis/lymphedema of abdominal panniculus and bilateral legs without acute findings.    Data Reviewed: Basic Metabolic  Panel:  Recent Labs Lab 10/14/14 1325 10/14/14 2230 10/15/14 0509 10/16/14 0545 10/17/14 0613  NA 141 140 140 140 141  K 6.0* 5.8* 5.3* 5.2* 4.6  CL 114* 114* 113* 112* 114*  CO2 19* 21* 19* 24 21*  GLUCOSE 135* 171* 134* 80 93  BUN 40* 40* 39* 33* 33*  CREATININE 2.89* 2.82* 2.74* 2.49* 2.25*  CALCIUM 8.4* 8.1* 8.1* 8.0* 8.1*   Liver Function Tests: No results for input(s): AST, ALT, ALKPHOS, BILITOT, PROT, ALBUMIN in the last 168 hours. No results for input(s): LIPASE, AMYLASE in the last 168 hours. No results for input(s): AMMONIA in the last 168 hours. CBC:  Recent Labs Lab 10/14/14 0237 10/14/14 0646 10/15/14 0509 10/16/14 0545 10/17/14 0613  WBC 6.4  --  5.9 4.9 5.3  NEUTROABS 4.4  --   --   --   --   HGB 9.2* 9.5* 9.0* 8.6* 9.0*  HCT 29.7* 28.0* 28.1* 27.0* 28.2*  MCV 87.6  --  88.4 88.5 88.4  PLT 181  --  195 188 191   Cardiac Enzymes: No results for input(s): CKTOTAL, CKMB, CKMBINDEX, TROPONINI in the last 168 hours. BNP (last 3 results) No results for input(s): PROBNP in the last 8760 hours. CBG:  Recent Labs Lab 10/16/14 1027 10/16/14 1224 10/16/14 1635 10/16/14 2220 10/17/14 0732  GLUCAP 122* 114* 101* 95 81    No results found for this or any previous visit (from the past 240 hour(s)).      Studies: No results found.      Scheduled Meds: . allopurinol  300 mg Oral Daily  . amLODipine  10 mg Oral Daily  . atorvastatin  20 mg Oral QHS  . cloNIDine  0.2 mg Oral TID  . docusate sodium  100 mg Oral BID  . ferrous sulfate  325 mg Oral Q breakfast  . gabapentin  100 mg Oral BID  . hydrOXYzine  25 mg Oral BID  . insulin aspart  0-9 Units Subcutaneous TID WC  . insulin NPH Human  45 Units Subcutaneous QAC supper  . insulin NPH Human  55 Units Subcutaneous QAC breakfast  . loratadine  10 mg Oral Daily  . nystatin   Topical BID  . pantoprazole  40 mg Oral Daily  . pramipexole  1 mg Oral QHS  . sodium chloride  3 mL Intravenous Q12H    . tamsulosin  0.4 mg Oral Daily  . tiZANidine  2 mg Oral BID  . triamcinolone 0.1 % cream : eucerin  1 application Topical Daily  . warfarin  6 mg Oral ONCE-1800  . Warfarin - Pharmacist Dosing Inpatient   Does not apply q1800   Continuous Infusions:    Active Problems:   Renal insufficiency   Acute renal failure    Time spent: 20 minutes    Lamontae Ricardo, MD, FACP, FHM. [facility] Pager [phone]  If 7PM-7AM, please contact night-coverage [email or web address] Password [email or web address] 10/17/2014, 11:20 AM    LOS: 3 days

## 2014-10-18 LAB — BASIC METABOLIC PANEL
ANION GAP: 7 (ref 5–15)
BUN: 34 mg/dL — ABNORMAL HIGH (ref 6–20)
CHLORIDE: 112 mmol/L — AB (ref 101–111)
CO2: 22 mmol/L (ref 22–32)
Calcium: 8.4 mg/dL — ABNORMAL LOW (ref 8.9–10.3)
Creatinine, Ser: 2.15 mg/dL — ABNORMAL HIGH (ref 0.61–1.24)
GFR calc Af Amer: 34 mL/min — ABNORMAL LOW (ref >60)
GFR, EST NON AFRICAN AMERICAN: 29 mL/min — AB (ref >60)
GLUCOSE: 88 mg/dL (ref 65–99)
POTASSIUM: 4.5 mmol/L (ref 3.5–5.1)
SODIUM: 141 mmol/L (ref 135–145)

## 2014-10-18 LAB — GLUCOSE, CAPILLARY
GLUCOSE-CAPILLARY: 82 mg/dL (ref 65–99)
GLUCOSE-CAPILLARY: 121 mg/dL — AB (ref 65–99)
Glucose-Capillary: 139 mg/dL — ABNORMAL HIGH (ref 65–99)

## 2014-10-18 LAB — PROTIME-INR
INR: 1.94 — AB (ref 0.00–1.49)
Prothrombin Time: 22.1 seconds — ABNORMAL HIGH (ref 11.6–15.2)

## 2014-10-18 MED ORDER — TRAMADOL HCL 50 MG PO TABS: 50.0000 mg | ORAL_TABLET | Freq: Four times a day (QID) | ORAL | Status: DC | PRN

## 2014-10-18 MED ORDER — INSULIN NPH (HUMAN) (ISOPHANE) 100 UNIT/ML ~~LOC~~ SUSP: 30.0000 [IU] | Freq: Every day | SUBCUTANEOUS | Status: DC

## 2014-10-18 MED ORDER — INSULIN NPH (HUMAN) (ISOPHANE) 100 UNIT/ML ~~LOC~~ SUSP: 55.0000 [IU] | Freq: Every day | SUBCUTANEOUS | Status: DC

## 2014-10-18 MED ORDER — WARFARIN SODIUM 6 MG PO TABS
6.0000 mg | ORAL_TABLET | Freq: Once | ORAL | Status: DC
  Filled 2014-10-18: qty 1

## 2014-10-18 MED ORDER — INSULIN ASPART 100 UNIT/ML ~~LOC~~ SOLN: 0.0000 [IU] | Freq: Three times a day (TID) | SUBCUTANEOUS | Status: DC

## 2014-10-18 MED ORDER — WARFARIN SODIUM 6 MG PO TABS
6.0000 mg | ORAL_TABLET | Freq: Once | ORAL | Status: AC
  Administered 2014-10-18: 6 mg via ORAL
  Filled 2014-10-18: qty 1

## 2014-10-18 NOTE — Progress Notes (Signed)
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 Codeine Itching  . Shellfish Allergy Nausea Only    Patient Measurements: Height: 5\' 5"  (165.1 cm) Weight: (!) 405 lb (183.707 kg) IBW/kg (Calculated) : 61.5  Vital Signs: Temp: 97.8 F (36.6 C) (08/29 0428) Temp Source: Oral (08/29 0428) BP: 134/52 mmHg (08/29 0428) Pulse Rate: 66 (08/29 0428)  Labs:  Recent Labs  10/16/14 0545 10/17/14 0613 10/18/14 0529  HGB 8.6* 9.0*  --   HCT 27.0* 28.2*  --   PLT 188 191  --   LABPROT 24.5* 23.6* 22.1*  INR 2.23* 2.13* 1.94*  CREATININE 2.49* 2.25* 2.15*    Estimated Creatinine Clearance: 49.2 mL/min (by C-G formula based on Cr of 2.15).   Medical History: Past Medical History  Diagnosis Date  . Morbid obesity   . Diabetes mellitus without complication     Medications:  Prescriptions prior to admission  Medication Sig Dispense Refill Last Dose  . allopurinol (ZYLOPRIM) 300 MG tablet Take 300 mg by mouth daily.   10/13/2014 at Unknown time  . amLODipine (NORVASC) 10 MG tablet Take 10 mg by mouth daily.   10/13/2014 at Unknown time  . atorvastatin (LIPITOR) 20 MG tablet Take 20 mg by mouth at bedtime.   10/12/2014 at Unknown time  . cloNIDine (CATAPRES) 0.2 MG tablet Take 0.2 mg by mouth 3 (three) times daily.   10/13/2014 at 1400  . diphenhydrAMINE (BENADRYL) 25 MG tablet Take 25 mg by mouth every 6 (six) hours as needed for itching.   10/12/2014 at Unknown time  . docusate sodium (COLACE) 100 MG capsule Take 100 mg by mouth 2 (two) times daily.   10/13/2014 at Unknown time  . ferrous sulfate 325 (65 FE) MG tablet Take 325 mg by mouth daily with breakfast.   10/13/2014 at Unknown time  . gabapentin (NEURONTIN) 100 MG capsule Take 100 mg by mouth 2 (two) times daily.   10/13/2014 at Unknown time  . hydrOXYzine (ATARAX/VISTARIL) 25 MG tablet Take 25 mg by mouth 2 (two) times daily.   10/13/2014 at  Unknown time  . insulin NPH Human (HUMULIN N,NOVOLIN N) 100 UNIT/ML injection Inject 60-70 Units into the skin 2 (two) times daily before a meal. Take 70 units every morning and Take 60 units every evening.   10/13/2014 at Unknown time  . insulin regular (NOVOLIN R,HUMULIN R) 100 units/mL injection Inject 3 Units into the skin 3 (three) times daily as needed for high blood sugar.   10/13/2014 at Unknown time  . levocetirizine (XYZAL) 5 MG tablet Take 5 mg by mouth every evening.   10/12/2014 at Unknown time  . lisinopril (PRINIVIL,ZESTRIL) 40 MG tablet Take 40 mg by mouth 2 (two) times daily.   10/13/2014 at Unknown time  . Nystatin (NYAMYC) 100000 UNIT/GM POWD Apply 1 g topically 2 (two) times daily.   4 10/13/2014 at Unknown time  . omeprazole (PRILOSEC) 20 MG capsule Take 20 mg by mouth daily.   10/13/2014 at Unknown time  . pramipexole (MIRAPEX) 1 MG tablet Take 1 mg by mouth at bedtime.   10/12/2014 at Unknown time  . Probiotic CAPS Take 1 capsule by mouth daily.   10/13/2014 at Unknown time  . SSD 1 % cream Apply 1 application topically daily as needed (sores).   0 Past Month at Unknown time  . tamsulosin (FLOMAX) 0.4 MG CAPS capsule Take 0.4 mg by mouth daily.  10/13/2014 at Unknown time  . tiZANidine (ZANAFLEX) 2 MG tablet Take 2 mg by mouth 2 (two) times daily.   10/13/2014 at Unknown time  . traMADol (ULTRAM) 50 MG tablet Take 50 mg by mouth every 6 (six) hours as needed for moderate pain or severe pain.   10/12/2014 at Unknown time  . Triamcinolone Acetonide (TRIAMCINOLONE 0.1 % CREAM : EUCERIN) CREA Apply 1 application topically daily.   10/13/2014 at Unknown time  . warfarin (COUMADIN) 10 MG tablet Take 5 mg by mouth daily.   10/12/2014 at 1800    Assessment: 69 yo obese M presented with knee pain, fracture.  He is on chronic Coumadin 5mg  daily.  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  He is followed by a Coumadin clinic in [facility] and most recently  has had home health nurse to come draw his INR.  He states he has been on 5mg  daily for quite a while.   10/18/2014:   INR 1.94 slightly below therapeutic  Hgb low but stable  No bleeding reported  Tolerating regular diet  No drug interactions  Goal of Therapy:  INR 2-3   Plan:   Warfarin to 6mg  po x1 today to avoid further decrease  Daily INR  Dolly Rias RPh 10/18/2014, 9:26 AM Pager [phone]

## 2014-10-18 NOTE — Progress Notes (Signed)
Report called to SNF,[facility] . Spoke to [employer] Rn

## 2014-10-18 NOTE — Progress Notes (Signed)
Physical Therapy Treatment Patient Details Name: Dale Henry MRN: [id number] DOB: 09-27-1945 Today's Date: 10/18/2014    History of Present Illness 69 yo male, morbidly obese, with hx HTN, HLD, DM, on Coumadin presented to [facility] ED via EMS for evaluation of left knee pain with imaging revealing Small nondisplaced fracture of the posterior tibia at the PCL    PT Comments    Pt partially sitting EOB on arrival attempting to void.   Assisted with completing scooting to EOB Total Assist and use of bed pad.  Pt sat EOB nearly 15 min to attempt voiding however unable to.  Unable to attempt sit to stand after due to MAX c/o "legs falling asleep" as pt stated the bed frame was pressing against the back of his thighs.  Used + 3 total assist to return pt to supine and position to comfort.    Follow Up Recommendations  SNF     Equipment Recommendations       Recommendations for Other Services       Precautions / Restrictions Precautions Precautions: Fall Restrictions Weight Bearing Restrictions: No LLE Weight Bearing: Weight bearing as tolerated    Mobility  Bed Mobility Overal bed mobility: Needs Assistance;+ 2 for safety/equipment;+2 for physical assistance Bed Mobility: Supine to Sit;Sit to Supine     Supine to sit: +2 for physical assistance;+2 for safety/equipment;Total assist Sit to supine: +2 for physical assistance;+2 for safety/equipment;Total assist (+ 3)   General bed mobility comments: pt assisted to EOB with increased assist needed and increased time.  Use of bed pad to complete scooting.  Sat EOB nearly 15 min to attempt voiding.   "My legs are falling asleep".  Did NOT attempt standing due to long sitting duration with bed frame presing on back of thighs.  Assisted back to supine with + 3 total assist and use  of bed pad to to scoot pt to [facility].    Transfers                 General transfer comment: unable to attempt due to long duration of sitting on EOB.     Ambulation/Gait                 Stairs            Wheelchair Mobility    Modified Rankin (Stroke Patients Only)       Balance                                    Cognition Arousal/Alertness: Awake/alert Behavior During Therapy: WFL for tasks assessed/performed Overall Cognitive Status: Within Functional Limits for tasks assessed                      Exercises      General Comments        Pertinent Vitals/Pain      Home Living                      Prior Function            PT Goals (current goals can now be found in the care plan section) Progress towards PT goals: Progressing toward goals    Frequency  Min 3X/week    PT Plan      Co-evaluation             End of Session  Activity Tolerance: Other (comment) Patient left: in bed;with call bell/phone within reach;with family/visitor present     Time: 1450-1516 PT Time Calculation (min) (ACUTE ONLY): 26 min  Charges:  $Therapeutic Activity: 23-37 mins                    G Codes:      Dale Henry  PTA [facility]  Acute  Rehab Pager      [phone]

## 2014-10-18 NOTE — Progress Notes (Signed)
Encouraged pt to void.given urinal and strategies to try.Po's pushed.wife at bedside.

## 2014-10-18 NOTE — Progress Notes (Signed)
[employer] authorization obtained (#: [id number]) & discharge summary done. [facility] will be called for transport once patient voids. RN, April aware.      Raynaldo Opitz, [facility] Clinical Social Worker cell #: [phone]

## 2014-10-18 NOTE — Progress Notes (Signed)
[facility] called for transport.     Xaden Kaufman, LCSW [facility] Clinical Social Worker cell #: [phone]  

## 2014-10-18 NOTE — Progress Notes (Signed)
Physician Discharge Summary  Dale Henry AJG:[id number] DOB: 04-29-1945 DOA: 10/13/2014  PCP: PROVIDER NOT IN SYSTEM  Admit date: 10/13/2014 Discharge date: 10/18/2014  Time spent: Greater than 30 minutes  Recommendations for Outpatient Follow-up:  1. M.D. at SNF in 3 days. 2. Daily PT & INR for Coumadin dose adjustment - next on 10/19/2014. Further labs at the discretion of SNF pharmacy managing patient's Coumadin dose 3. CBC & BMP twice a week, next on 10/21/14 4. BiPAP at bedtime: Auto mode with 2 L bled in 5. Recommend outpatient pulmonology consultation for sleep study 6. Dr. Paralee Cancel, Orthopedics: As needed regarding management of left tibial fracture 7. Dr. Ella Jubilee, PCP with [facility]: Upon discharge from SNF.  Discharge Diagnoses:  Active Problems:   Renal insufficiency   Acute renal failure   Discharge Condition: Improved & Stable  Diet recommendation: Heart healthy and diabetic diet.  Filed Weights   10/16/14 0436 10/17/14 0549 10/18/14 0428  Weight: 193.232 kg (426 lb) 184.16 kg (406 lb) 183.707 kg (405 lb)    History of present illness:  69 year old male patient, married, lives with spouse, ambulates with the help of a walker, PMH of DM 2, stage III chronic kidney disease, HTN, GERD, dermatitis/stasis dermatitis, gout, HLD, iron deficiency anemia, lymphedema of abdominal panniculus and legs, morbid obesity, PAD on Coumadin and TIA presented to the [facility] ED via EMS on 10/14/14 secondary to left knee pain that developed suddenly when he was climbing up stairs. This was associated with a popping sound in the knee. He was unable to bear weight. In ED, patient noted to be hemodynamically stable except initial oxygen saturation 88% on room air. Blood work notable for Hg 9.2, K 5.9, CO2 18, BUN 45, Cr 3.06. Imaging studies included CT of the left knee w/o contrast and notable for nondisplaced fracture of the posterior tibia at the PCL  insertion with moderate joint effusion. Orthopedic team was consulted for further assistance, [facility] asked to admit for further evaluation.  Hospital Course:   Active Problems: Nondisplaced left posterior tibial fracture, findings radiographically consistent with PCL avulsion - Orthopedics consulted on 8/25 and recommended: Due to significant challenges his weight poses on any functional activity, will not restrict him other than his own pain. So, WBAT on LLE and PT evaluation - No orthopedic follow-up - Pain decreased. Work with PT on 8/26 >recommend SNF.   Acute on stage III chronic kidney disease - Reviewed the records from outpatient PCP. Creatinine of 2.6 and potassium 6.1 on 09/27/14 - Admitted with creatinine off 3.06 and potassium of 5.9 suggesting acute on chronic kidney disease - Acute renal failure may be precipitated by lisinopril and some degree of dehydration. - Renal ultrasound: Right kidney without hydronephrosis. Left kidney not visualized. - FeNA 6.8 ? ATN - Creatinine continues to improve. Nonoliguric. - Foley to be discontinued prior to discharge. Periodic follow-up of BMP at SNF. Continue to hold ACEI for now and can be reassessed at SNF.  Acute hypoxic respiratory failure/OSA - Patient has underlying undiagnosed OHS/OSA. On 8/27, he stated that he did have outpatient sleep study couple years ago and was told to have sleep apnea but no further interventions done - Overnight 8/26, desaturated to 63% on room air while asleep. Will place patient on nightly CPAP and when necessary during sleep in the day on auto titrate mode. Will need outpatient repeat of sleep study and management. - Chest x-ray: No acute findings. - As per nursing,  not hypoxic during daytime while awake. Tolerating CPAP at bedtime.  Hyperkalemia with metabolic acidosis (mild, NAG) - Secondary to acute renal failure and ACEI.? RTA 4.  - Continue treating acute kidney injury with IV fluids. - Status post  couple of doses of Kayexalate on 8/25 & a dose on 8/26. Potassium has improved from 5.9-5.2. We will repeat a dose of Kayexalate 60 g 1 - Resolved  Anemia of chronic disease/iron deficiency/chronic kidney disease - Hemoglobin outpatient on 09/27/14 was 9.6. - Stable  Essential hypertension - Continue Norvasc and clonidine the patient takes at home - Hold lisinopril as noted above  - Reasonable inpatient control.  Hyperlipidemia - continue statin per home medical regimen  - Lipid panel unremarkable except for HDL of 34.  Uncontrolled DM 2 with chronic kidney disease/hypoglycemia (8/27) - Patient on high-dose insulin regimen at home (70 units in a.m., 60 units in p.m of NPH insulin). Has good appetite. - Continue gabapentin for neuropathies  - A1c on 09/27/14:6.7. - Placed on reduced dose of maintenance insulin as below, in the hospital based on CBGs. These can be titrated as needed at SNF.  On Anticoagulation  - As per PCP notes, on anticoagulation for PAD - Continue Coumadin per pharmacy and defer further management to outpatient follow-up with PCP. - INR today 1.94. He is receiving slightly higher dose of Coumadin prior to discharge. Follow INR closely at SNF and adjust Coumadin as needed.  Morbid obesity - Body mass index is 70.55 kg/(m^2).  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 Was supposed to see podiatrist as outpatient. - No acute findings.   Consultants:  Orthopedics  Procedures:  Indwelling Foley catheter - DC 8/29  Discharge Exam:  Complaints: Denies complaints. Unhappy that Foley catheter is being removed. Foley catheter was placed in the ED. Advised him and his wife at bedside that with no clear indication, there is no reason to leave Foley catheter in place which can lead to several complications including UTI and sepsis.  Filed Vitals:   10/17/14 1345 10/17/14 2108 10/18/14 0428 10/18/14 0915   BP: 127/51 165/61 134/52 168/61  Pulse: 76 77 66 69  Temp: 98.6 F (37 C) 98.3 F (36.8 C) 97.8 F (36.6 C)   TempSrc: Oral Oral Oral   Resp: 16 18 18    Height:      Weight:   183.707 kg (405 lb)   SpO2: 95% 96% 96%     General exam: Moderately built and morbidly obese pleasant elderly male sitting up in bed. Respiratory system: Clear. No increased work of breathing. Cardiovascular system: S1 & S2 heard, RRR. No JVD, murmurs, gallops, clicks. .  Gastrointestinal system: Abdomen is protuberant, soft and nontender. Normal bowel sounds heard.  Central nervous system: Alert and oriented. No focal neurological deficits. Extremities: Symmetric 5 x 5 power. Skin: Chronic skin changes of stasis dermatitis/lymphedema of abdominal panniculus and bilateral legs without acute findings.  Discharge Instructions      Discharge Instructions    Call MD for:  difficulty breathing, headache or visual disturbances    Complete by:  As directed      Call MD for:  extreme fatigue    Complete by:  As directed      Call MD for:  persistant dizziness or light-headedness    Complete by:  As directed      Call MD for:  persistant nausea and vomiting    Complete by:  As directed      Call MD for:  redness, tenderness, or signs of infection (pain, swelling, redness, odor or green/yellow discharge around incision site)    Complete by:  As directed      Call MD for:  severe uncontrolled pain    Complete by:  As directed      Call MD for:  temperature >100.4    Complete by:  As directed      Diet - low sodium heart healthy    Complete by:  As directed      Diet Carb Modified    Complete by:  As directed      Discharge instructions    Complete by:  As directed   CPAP at bedtime - CPAP Auto Mode with 2L bled in. Will need sleep study as outpatient.     Increase activity slowly    Complete by:  As directed             Medication List    STOP taking these medications        insulin regular  100 units/mL injection  Commonly known as:  NOVOLIN R,HUMULIN R     lisinopril 40 MG tablet  Commonly known as:  PRINIVIL,ZESTRIL      TAKE these medications        allopurinol 300 MG tablet  Commonly known as:  ZYLOPRIM  Take 300 mg by mouth daily.     amLODipine 10 MG tablet  Commonly known as:  NORVASC  Take 10 mg by mouth daily.     atorvastatin 20 MG tablet  Commonly known as:  LIPITOR  Take 20 mg by mouth at bedtime.     cloNIDine 0.2 MG tablet  Commonly known as:  CATAPRES  Take 0.2 mg by mouth 3 (three) times daily.     diphenhydrAMINE 25 MG tablet  Commonly known as:  BENADRYL  Take 25 mg by mouth every 6 (six) hours as needed for itching.     docusate sodium 100 MG capsule  Commonly known as:  COLACE  Take 100 mg by mouth 2 (two) times daily.     ferrous sulfate 325 (65 FE) MG tablet  Take 325 mg by mouth daily with breakfast.     gabapentin 100 MG capsule  Commonly known as:  NEURONTIN  Take 100 mg by mouth 2 (two) times daily.     hydrOXYzine 25 MG tablet  Commonly known as:  ATARAX/VISTARIL  Take 25 mg by mouth 2 (two) times daily.     insulin aspart 100 UNIT/ML injection  Commonly known as:  novoLOG  Inject 0-9 Units into the skin 3 (three) times daily with meals. CBG < 70: implement hypoglycemia protocol CBG 70 - 120: 0 units CBG 121 - 150: 1 unit CBG 151 - 200: 2 units CBG 201 - 250: 3 units CBG 251 - 300: 5 units CBG 301 - 350: 7 units CBG 351 - 400: 9 units CBG > 400: call MD.     insulin NPH Human 100 UNIT/ML injection  Commonly known as:  HUMULIN N,NOVOLIN N  Inject 0.55 mLs (55 Units total) into the skin daily before breakfast.     insulin NPH Human 100 UNIT/ML injection  Commonly known as:  HUMULIN N,NOVOLIN N  Inject 0.3 mLs (30 Units total) into the skin daily before supper.     levocetirizine 5 MG tablet  Commonly known as:  XYZAL  Take 5 mg by mouth every evening.  NYAMYC 100000 UNIT/GM Powd  Apply 1 g topically 2 (two) times  daily.     omeprazole 20 MG capsule  Commonly known as:  PRILOSEC  Take 20 mg by mouth daily.     pramipexole 1 MG tablet  Commonly known as:  MIRAPEX  Take 1 mg by mouth at bedtime.     Probiotic Caps  Take 1 capsule by mouth daily.     SSD 1 % cream  Generic drug:  silver sulfADIAZINE  Apply 1 application topically daily as needed (sores).     tamsulosin 0.4 MG Caps capsule  Commonly known as:  FLOMAX  Take 0.4 mg by mouth daily.     tiZANidine 2 MG tablet  Commonly known as:  ZANAFLEX  Take 2 mg by mouth 2 (two) times daily.     traMADol 50 MG tablet  Commonly known as:  ULTRAM  Take 1 tablet (50 mg total) by mouth every 6 (six) hours as needed for moderate pain or severe pain.     triamcinolone 0.1 % cream : eucerin Crea  Apply 1 application topically daily.     warfarin 10 MG tablet  Commonly known as:  COUMADIN  Take 5 mg by mouth daily.       Follow-up Information    Follow up with [facility].   Why:  HHRN/HHPT   Contact information:   [address] [address] 1 [phone] fax#[phone]       The results of significant diagnostics from this hospitalization (including imaging, microbiology, ancillary and laboratory) are listed below for reference.    Significant Diagnostic Studies: Dg Chest 2 View  10/14/2014   CLINICAL DATA:  Hypoxia  EXAM: CHEST - 2 VIEW  COMPARISON:  02/11/2014  FINDINGS: Cardiac shadow remains enlarged. The lungs are well aerated bilaterally. Pleural thickening is noted laterally on the right stable from the prior exam. No acute bony abnormality is seen.  IMPRESSION: No active disease.   Electronically Signed   By: Inez Catalina M.D.   On: 10/14/2014 13:27   Ct Knee Left Wo Contrast  10/14/2014   CLINICAL DATA:  Left leg pain. Twisted left knee. Unable to bear weight.  EXAM: CT OF THE LEFT KNEE WITHOUT CONTRAST  TECHNIQUE: Multidetector CT imaging of the LEFT knee was performed  according to the standard protocol. Multiplanar CT image reconstructions were also generated.  COMPARISON:  None.  FINDINGS: There is severe osteopenia. There is a small nondisplaced fracture at the PCL insertion involving the posterior tibia. There is no other fracture or dislocation. There is a moderate joint effusion.  There is no lytic or sclerotic osseous lesion. There is no bone destruction.  There is severe soft tissue edema in the subcutaneous fat circumferentially around the left knee with skin thickening. There is no drainable fluid collection or hematoma. There is peripheral vascular atherosclerotic disease.  IMPRESSION: 1. Small nondisplaced fracture of the posterior tibia at the PCL insertion. 2. Nonspecific, circumferential edema in the subcutaneous fat around the left knee. 3. Moderate joint effusion. 4. Peripheral vascular disease.   Electronically Signed   By: Kathreen Devoid   On: 10/14/2014 08:19   US Renal  10/14/2014   CLINICAL DATA:  Renal insufficiency  EXAM: RENAL / URINARY TRACT ULTRASOUND COMPLETE  COMPARISON:  02/10/2014  FINDINGS: Right Kidney:  Length: 11.7 cm. Cortical echogenicity is grossly within normal limits. No obvious hydronephrosis. There is  a 2.2 x 1.2 x 2.1 cm benign appearing cyst in the interpolar region.  Left Kidney:  Not visualized.  Bladder:  Not visualized.  IMPRESSION: Left kidney and bladder were not visualized likely due to body habitus.  No evidence of hydronephrosis in the left kidney. A 2.2 cm benign appearing cyst in the interpolar region is noted.   Electronically Signed   By: Marybelle Killings M.D.   On: 10/14/2014 08:29   Dg Knee Complete 4 Views Left  10/13/2014   CLINICAL DATA:  Status post fall on stairs at home.  EXAM: LEFT KNEE - COMPLETE 4+ VIEW  COMPARISON:  None.  FINDINGS: There is a subtle lucency involving the proximal-mid fibular diaphysis which may reflect a vascular foramen versus a nondisplaced fracture. There is no other acute fracture or  dislocation. There is generalized osteopenia. There is no lytic or sclerotic osseous lesion. There is no significant joint effusion. There is peripheral vascular atherosclerotic disease.  IMPRESSION: Subtle lucency involving the proximal-mid fibular diaphysis which may reflect a vascular foramen versus a nondisplaced fracture.   Electronically Signed   By: Kathreen Devoid   On: 10/13/2014 23:30   Dg Hip Unilat With Pelvis 2-3 Views Left  10/14/2014   CLINICAL DATA:  Patient fell on steps on 08/24. Limited movement. Pain on palpation.  EXAM: DG HIP (WITH OR WITHOUT PELVIS) 2-3V LEFT  COMPARISON:  None.  FINDINGS: Degenerative changes in both hips. Pelvis and left hip appear intact. No evidence of acute fracture or dislocation. No focal bone lesion or bone destruction. Vascular calcifications and calcified phleboliths.  IMPRESSION: Degenerative changes.  No acute fractures demonstrated.   Electronically Signed   By: Lucienne Capers M.D.   On: 10/14/2014 02:30   Dg Femur Min 2 Views Left  10/14/2014   CLINICAL DATA:  Pain after a fall.  EXAM: LEFT FEMUR 2 VIEWS  COMPARISON:  None.  FINDINGS: Mild degenerative changes in the left hip and left knee. Vascular calcifications. No evidence of acute fracture or subluxation. No focal bone lesion or bone destruction. Bone cortex and trabecular architecture appear intact. No radiopaque soft tissue foreign bodies.  IMPRESSION: No acute bony abnormalities.   Electronically Signed   By: Lucienne Capers M.D.   On: 10/14/2014 02:31    Microbiology: No results found for this or any previous visit (from the past 240 hour(s)).   Labs: Basic Metabolic Panel:  Recent Labs Lab 10/14/14 2230 10/15/14 0509 10/16/14 0545 10/17/14 0613 10/18/14 0529  NA 140 140 140 141 141  K 5.8* 5.3* 5.2* 4.6 4.5  CL 114* 113* 112* 114* 112*  CO2 21* 19* 24 21* 22  GLUCOSE 171* 134* 80 93 88  BUN 40* 39* 33* 33* 34*  CREATININE 2.82* 2.74* 2.49* 2.25* 2.15*  CALCIUM 8.1* 8.1* 8.0*  8.1* 8.4*   Liver Function Tests: No results for input(s): AST, ALT, ALKPHOS, BILITOT, PROT, ALBUMIN in the last 168 hours. No results for input(s): LIPASE, AMYLASE in the last 168 hours. No results for input(s): AMMONIA in the last 168 hours. CBC:  Recent Labs Lab 10/14/14 0237 10/14/14 0646 10/15/14 0509 10/16/14 0545 10/17/14 0613  WBC 6.4  --  5.9 4.9 5.3  NEUTROABS 4.4  --   --   --   --   HGB 9.2* 9.5* 9.0* 8.6* 9.0*  HCT 29.7* 28.0* 28.1* 27.0* 28.2*  MCV 87.6  --  88.4 88.5 88.4  PLT 181  --  195 188 191   Cardiac  Enzymes: No results for input(s): CKTOTAL, CKMB, CKMBINDEX, TROPONINI in the last 168 hours. BNP: BNP (last 3 results) No results for input(s): BNP in the last 8760 hours.  ProBNP (last 3 results) No results for input(s): PROBNP in the last 8760 hours.  CBG:  Recent Labs Lab 10/17/14 1128 10/17/14 1648 10/17/14 2107 10/18/14 0729 10/18/14 1139  GLUCAP 143* 126* 94 82 139*      Signed:  Kimi Bordeau, MD, FACP, FHM. [facility] Pager [phone]  If 7PM-7AM, please contact night-coverage [email or web address] Password [facility] 10/18/2014, 12:45 PM

## 2014-10-18 NOTE — Clinical Social Work Placement (Signed)
Patient is set to discharge to [facility] today. Patient & wife at bedside aware. Discharge packet given to RN, April. [facility] will be called for transport once [facility] authorization is obtained & discharge summary is complete.     Raynaldo Opitz, [facility] Clinical Social Worker cell #: [phone]    CLINICAL SOCIAL WORK PLACEMENT  NOTE  Date:  10/18/2014  Patient Details  Name: Dale Henry MRN: [id number] Date of Birth: 09-14-1945  Clinical Social Work is seeking post-discharge placement for this patient at the [place] level of care (*CSW will initial, date and re-position this form in  chart as items are completed):  Yes   Patient/family provided with [facility] Work Department's list of facilities offering this level of care within the geographic area requested by the patient (or if unable, by the patient's family).  Yes   Patient/family informed of their freedom to choose among providers that offer the needed level of care, that participate in Medicare, Medicaid or managed care program needed by the patient, have an available bed and are willing to accept the patient.  Yes   Patient/family informed of [facility]'s ownership interest in [facility] and [facility], as well as of the fact that they are under no obligation to receive care at these facilities.  PASRR submitted to EDS on       PASRR number received on       Existing PASRR number confirmed on 10/15/14     FL2 transmitted to all facilities in geographic area requested by pt/family on 10/15/14     FL2 transmitted to all facilities within larger geographic area on       Patient informed that his/her managed care company has contracts with or will negotiate with certain facilities, including the following:        Yes   Patient/family informed of bed offers received.  Patient chooses bed at  [facility])     Physician  recommends and patient chooses bed at      Patient to be transferred to  [facility]) on 10/18/14.  Patient to be transferred to facility by [facility]     Patient family notified on 10/18/14 of transfer.  Name of family member notified:  patient's wife, Tonita at bedside     PHYSICIAN       Additional Comment:    _______________________________________________ Standley Brooking, LCSW 10/18/2014, 12:32 PM

## 2014-10-18 NOTE — Progress Notes (Signed)
D/C via [facility] via stretcher to SNF .Voices no C/O.

## 2014-10-18 NOTE — Discharge Summary (Signed)
Physician Discharge Summary  JADORE VEALS AOZ:[id number] DOB: 14-Nov-1945 DOA: 10/13/2014  PCP: PROVIDER NOT IN SYSTEM  Admit date: 10/13/2014 Discharge date: 10/18/2014  Time spent: Greater than 30 minutes  Recommendations for Outpatient Follow-up:  1. M.D. at SNF in 3 days. 2. Daily PT & INR for Coumadin dose adjustment - next on 10/19/2014. Further labs at the discretion of SNF pharmacy managing patient's Coumadin dose 3. CBC & BMP twice a week, next on 10/21/14 4. BiPAP at bedtime: Auto mode with 2 L bled in 5. Recommend outpatient pulmonology consultation for sleep study 6. Dr. Paralee Cancel, Orthopedics: As needed regarding management of left tibial fracture 7. Dr. Ella Jubilee, PCP with [facility]: Upon discharge from SNF. 8. Recommend out patient Urology consultation for Urinary retention evaluation and follow up.  Discharge Diagnoses:  Active Problems:   Renal insufficiency   Acute renal failure   Discharge Condition: Improved & Stable  Diet recommendation: Heart healthy and diabetic diet.  Filed Weights   10/16/14 0436 10/17/14 0549 10/18/14 0428  Weight: 193.232 kg (426 lb) 184.16 kg (406 lb) 183.707 kg (405 lb)    History of present illness:  69 year old male patient, married, lives with spouse, ambulates with the help of a walker, PMH of DM 2, stage III chronic kidney disease, HTN, GERD, dermatitis/stasis dermatitis, gout, HLD, iron deficiency anemia, lymphedema of abdominal panniculus and legs, morbid obesity, PAD on Coumadin and TIA presented to the [facility] ED via EMS on 10/14/14 secondary to left knee pain that developed suddenly when he was climbing up stairs. This was associated with a popping sound in the knee. He was unable to bear weight. In ED, patient noted to be hemodynamically stable except initial oxygen saturation 88% on room air. Blood work notable for Hg 9.2, K 5.9, CO2 18, BUN 45, Cr 3.06. Imaging studies included CT of the  left knee w/o contrast and notable for nondisplaced fracture of the posterior tibia at the PCL insertion with moderate joint effusion. Orthopedic team was consulted for further assistance, TRH asked to admit for further evaluation.  Hospital Course:   Active Problems: Nondisplaced left posterior tibial fracture, findings radiographically consistent with PCL avulsion - Orthopedics consulted on 8/25 and recommended: Due to significant challenges his weight poses on any functional activity, will not restrict him other than his own pain. So, WBAT on LLE and PT evaluation - No orthopedic follow-up - Pain decreased. Work with PT on 8/26 >recommend SNF.   Acute on stage III chronic kidney disease - Reviewed the records from outpatient PCP. Creatinine of 2.6 and potassium 6.1 on 09/27/14 - Admitted with creatinine off 3.06 and potassium of 5.9 suggesting acute on chronic kidney disease - Acute renal failure may be precipitated by lisinopril and some degree of dehydration. - Renal ultrasound: Right kidney without hydronephrosis. Left kidney not visualized. - FeNA 6.8 ? ATN - Creatinine continues to improve. Nonoliguric. - Foley to be discontinued prior to discharge. Periodic follow-up of BMP at SNF. Continue to hold ACEI for now and can be reassessed at SNF.  Acute hypoxic respiratory failure/OSA - Patient has underlying undiagnosed OHS/OSA. On 8/27, he stated that he did have outpatient sleep study couple years ago and was told to have sleep apnea but no further interventions done - Overnight 8/26, desaturated to 63% on room air while asleep. Will place patient on nightly CPAP and when necessary during sleep in the day on auto titrate mode. Will need outpatient repeat of sleep  study and management. - Chest x-ray: No acute findings. - As per nursing, not hypoxic during daytime while awake. Tolerating CPAP at bedtime.  Hyperkalemia with metabolic acidosis (mild, NAG) - Secondary to acute renal failure  and ACEI.? RTA 4.  - Continue treating acute kidney injury with IV fluids. - Status post couple of doses of Kayexalate on 8/25 & a dose on 8/26. Potassium has improved from 5.9-5.2. We will repeat a dose of Kayexalate 60 g 1 - Resolved  Anemia of chronic disease/iron deficiency/chronic kidney disease - Hemoglobin outpatient on 09/27/14 was 9.6. - Stable  Essential hypertension - Continue Norvasc and clonidine the patient takes at home - Hold lisinopril as noted above  - Reasonable inpatient control.  Hyperlipidemia - continue statin per home medical regimen  - Lipid panel unremarkable except for HDL of 34.  Uncontrolled DM 2 with chronic kidney disease/hypoglycemia (8/27) - Patient on high-dose insulin regimen at home (70 units in a.m., 60 units in p.m of NPH insulin). Has good appetite. - Continue gabapentin for neuropathies  - A1c on 09/27/14:6.7. - Placed on reduced dose of maintenance insulin as below, in the hospital based on CBGs. These can be titrated as needed at SNF.  On Anticoagulation  - As per PCP notes, on anticoagulation for PAD - Continue Coumadin per pharmacy and defer further management to outpatient follow-up with PCP. - INR today 1.94. He is receiving slightly higher dose of Coumadin prior to discharge. Follow INR closely at SNF and adjust Coumadin as needed.  Morbid obesity - Body mass index is 70.55 kg/(m^2).  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 Was supposed to see podiatrist as outpatient. - No acute findings.  Acute Urinary Retention - Foley catheter was placed on admission. Removed this AM. Did not void despite few hours. In & out cath > 900 mls. Will place indwelling foley catheter and recommend OP Urology consultation and consider starting Flomax    Consultants:  Orthopedics  Procedures:  Indwelling Foley catheter - DC 8/29  Discharge Exam:  Complaints: Denies complaints.  Unhappy that Foley catheter is being removed. Foley catheter was placed in the ED. Advised him and his wife at bedside that with no clear indication, there is no reason to leave Foley catheter in place which can lead to several complications including UTI and sepsis.  Filed Vitals:   10/17/14 1345 10/17/14 2108 10/18/14 0428 10/18/14 0915  BP: 127/51 165/61 134/52 168/61  Pulse: 76 77 66 69  Temp: 98.6 F (37 C) 98.3 F (36.8 C) 97.8 F (36.6 C)   TempSrc: Oral Oral Oral   Resp: 16 18 18    Height:      Weight:   183.707 kg (405 lb)   SpO2: 95% 96% 96%     General exam: Moderately built and morbidly obese pleasant elderly male sitting up in bed. Respiratory system: Clear. No increased work of breathing. Cardiovascular system: S1 & S2 heard, RRR. No JVD, murmurs, gallops, clicks. .  Gastrointestinal system: Abdomen is protuberant, soft and nontender. Normal bowel sounds heard.  Central nervous system: Alert and oriented. No focal neurological deficits. Extremities: Symmetric 5 x 5 power. Skin: Chronic skin changes of stasis dermatitis/lymphedema of abdominal panniculus and bilateral legs without acute findings.  Discharge Instructions      Discharge Instructions    Call MD for:  difficulty breathing, headache or visual disturbances    Complete by:  As directed  Call MD for:  extreme fatigue    Complete by:  As directed      Call MD for:  persistant dizziness or light-headedness    Complete by:  As directed      Call MD for:  persistant nausea and vomiting    Complete by:  As directed      Call MD for:  redness, tenderness, or signs of infection (pain, swelling, redness, odor or green/yellow discharge around incision site)    Complete by:  As directed      Call MD for:  severe uncontrolled pain    Complete by:  As directed      Call MD for:  temperature >100.4    Complete by:  As directed      Diet - low sodium heart healthy    Complete by:  As directed      Diet Carb  Modified    Complete by:  As directed      Discharge instructions    Complete by:  As directed   CPAP at bedtime - CPAP Auto Mode with 2L bled in. Will need sleep study as outpatient.     Increase activity slowly    Complete by:  As directed             Medication List    STOP taking these medications        insulin regular 100 units/mL injection  Commonly known as:  NOVOLIN R,HUMULIN R     lisinopril 40 MG tablet  Commonly known as:  PRINIVIL,ZESTRIL      TAKE these medications        allopurinol 300 MG tablet  Commonly known as:  ZYLOPRIM  Take 300 mg by mouth daily.     amLODipine 10 MG tablet  Commonly known as:  NORVASC  Take 10 mg by mouth daily.     atorvastatin 20 MG tablet  Commonly known as:  LIPITOR  Take 20 mg by mouth at bedtime.     cloNIDine 0.2 MG tablet  Commonly known as:  CATAPRES  Take 0.2 mg by mouth 3 (three) times daily.     diphenhydrAMINE 25 MG tablet  Commonly known as:  BENADRYL  Take 25 mg by mouth every 6 (six) hours as needed for itching.     docusate sodium 100 MG capsule  Commonly known as:  COLACE  Take 100 mg by mouth 2 (two) times daily.     ferrous sulfate 325 (65 FE) MG tablet  Take 325 mg by mouth daily with breakfast.     gabapentin 100 MG capsule  Commonly known as:  NEURONTIN  Take 100 mg by mouth 2 (two) times daily.     hydrOXYzine 25 MG tablet  Commonly known as:  ATARAX/VISTARIL  Take 25 mg by mouth 2 (two) times daily.     insulin aspart 100 UNIT/ML injection  Commonly known as:  novoLOG  Inject 0-9 Units into the skin 3 (three) times daily with meals. CBG < 70: implement hypoglycemia protocol CBG 70 - 120: 0 units CBG 121 - 150: 1 unit CBG 151 - 200: 2 units CBG 201 - 250: 3 units CBG 251 - 300: 5 units CBG 301 - 350: 7 units CBG 351 - 400: 9 units CBG > 400: call MD.     insulin NPH Human 100 UNIT/ML injection  Commonly known as:  HUMULIN N,NOVOLIN N  Inject 0.55 mLs (55 Units total) into the skin daily  before  breakfast.     insulin NPH Human 100 UNIT/ML injection  Commonly known as:  HUMULIN N,NOVOLIN N  Inject 0.3 mLs (30 Units total) into the skin daily before supper.     levocetirizine 5 MG tablet  Commonly known as:  XYZAL  Take 5 mg by mouth every evening.     NYAMYC 100000 UNIT/GM Powd  Apply 1 g topically 2 (two) times daily.     omeprazole 20 MG capsule  Commonly known as:  PRILOSEC  Take 20 mg by mouth daily.     pramipexole 1 MG tablet  Commonly known as:  MIRAPEX  Take 1 mg by mouth at bedtime.     Probiotic Caps  Take 1 capsule by mouth daily.     SSD 1 % cream  Generic drug:  silver sulfADIAZINE  Apply 1 application topically daily as needed (sores).     tamsulosin 0.4 MG Caps capsule  Commonly known as:  FLOMAX  Take 0.4 mg by mouth daily.     tiZANidine 2 MG tablet  Commonly known as:  ZANAFLEX  Take 2 mg by mouth 2 (two) times daily.     traMADol 50 MG tablet  Commonly known as:  ULTRAM  Take 1 tablet (50 mg total) by mouth every 6 (six) hours as needed for moderate pain or severe pain.     triamcinolone 0.1 % cream : eucerin Crea  Apply 1 application topically daily.     warfarin 10 MG tablet  Commonly known as:  COUMADIN  Take 5 mg by mouth daily.       Follow-up Information    Follow up with [facility].   Why:  HHRN/HHPT   Contact information:   [place] [address] [place] [address] 1 [phone] fax#[phone]       The results of significant diagnostics from this hospitalization (including imaging, microbiology, ancillary and laboratory) are listed below for reference.    Significant Diagnostic Studies: Dg Chest 2 View  10/14/2014   CLINICAL DATA:  Hypoxia  EXAM: CHEST - 2 VIEW  COMPARISON:  02/11/2014  FINDINGS: Cardiac shadow remains enlarged. The lungs are well aerated bilaterally. Pleural thickening is noted laterally on the right stable from the prior exam. No acute bony abnormality is  seen.  IMPRESSION: No active disease.   Electronically Signed   By: Inez Catalina M.D.   On: 10/14/2014 13:27   Ct Knee Left Wo Contrast  10/14/2014   CLINICAL DATA:  Left leg pain. Twisted left knee. Unable to bear weight.  EXAM: CT OF THE LEFT KNEE WITHOUT CONTRAST  TECHNIQUE: Multidetector CT imaging of the LEFT knee was performed according to the standard protocol. Multiplanar CT image reconstructions were also generated.  COMPARISON:  None.  FINDINGS: There is severe osteopenia. There is a small nondisplaced fracture at the PCL insertion involving the posterior tibia. There is no other fracture or dislocation. There is a moderate joint effusion.  There is no lytic or sclerotic osseous lesion. There is no bone destruction.  There is severe soft tissue edema in the subcutaneous fat circumferentially around the left knee with skin thickening. There is no drainable fluid collection or hematoma. There is peripheral vascular atherosclerotic disease.  IMPRESSION: 1. Small nondisplaced fracture of the posterior tibia at the PCL insertion. 2. Nonspecific, circumferential edema in the subcutaneous fat around the left knee. 3. Moderate joint effusion. 4. Peripheral vascular disease.   Electronically  Signed   By: Kathreen Devoid   On: 10/14/2014 08:19   US Renal  10/14/2014   CLINICAL DATA:  Renal insufficiency  EXAM: RENAL / URINARY TRACT ULTRASOUND COMPLETE  COMPARISON:  02/10/2014  FINDINGS: Right Kidney:  Length: 11.7 cm. Cortical echogenicity is grossly within normal limits. No obvious hydronephrosis. There is a 2.2 x 1.2 x 2.1 cm benign appearing cyst in the interpolar region.  Left Kidney:  Not visualized.  Bladder:  Not visualized.  IMPRESSION: Left kidney and bladder were not visualized likely due to body habitus.  No evidence of hydronephrosis in the left kidney. A 2.2 cm benign appearing cyst in the interpolar region is noted.   Electronically Signed   By: Marybelle Killings M.D.   On: 10/14/2014 08:29   Dg Knee  Complete 4 Views Left  10/13/2014   CLINICAL DATA:  Status post fall on stairs at home.  EXAM: LEFT KNEE - COMPLETE 4+ VIEW  COMPARISON:  None.  FINDINGS: There is a subtle lucency involving the proximal-mid fibular diaphysis which may reflect a vascular foramen versus a nondisplaced fracture. There is no other acute fracture or dislocation. There is generalized osteopenia. There is no lytic or sclerotic osseous lesion. There is no significant joint effusion. There is peripheral vascular atherosclerotic disease.  IMPRESSION: Subtle lucency involving the proximal-mid fibular diaphysis which may reflect a vascular foramen versus a nondisplaced fracture.   Electronically Signed   By: Kathreen Devoid   On: 10/13/2014 23:30   Dg Hip Unilat With Pelvis 2-3 Views Left  10/14/2014   CLINICAL DATA:  Patient fell on steps on 08/24. Limited movement. Pain on palpation.  EXAM: DG HIP (WITH OR WITHOUT PELVIS) 2-3V LEFT  COMPARISON:  None.  FINDINGS: Degenerative changes in both hips. Pelvis and left hip appear intact. No evidence of acute fracture or dislocation. No focal bone lesion or bone destruction. Vascular calcifications and calcified phleboliths.  IMPRESSION: Degenerative changes.  No acute fractures demonstrated.   Electronically Signed   By: Lucienne Capers M.D.   On: 10/14/2014 02:30   Dg Femur Min 2 Views Left  10/14/2014   CLINICAL DATA:  Pain after a fall.  EXAM: LEFT FEMUR 2 VIEWS  COMPARISON:  None.  FINDINGS: Mild degenerative changes in the left hip and left knee. Vascular calcifications. No evidence of acute fracture or subluxation. No focal bone lesion or bone destruction. Bone cortex and trabecular architecture appear intact. No radiopaque soft tissue foreign bodies.  IMPRESSION: No acute bony abnormalities.   Electronically Signed   By: Lucienne Capers M.D.   On: 10/14/2014 02:31    Microbiology: No results found for this or any previous visit (from the past 240 hour(s)).   Labs: Basic  Metabolic Panel:  Recent Labs Lab 10/14/14 2230 10/15/14 0509 10/16/14 0545 10/17/14 0613 10/18/14 0529  NA 140 140 140 141 141  K 5.8* 5.3* 5.2* 4.6 4.5  CL 114* 113* 112* 114* 112*  CO2 21* 19* 24 21* 22  GLUCOSE 171* 134* 80 93 88  BUN 40* 39* 33* 33* 34*  CREATININE 2.82* 2.74* 2.49* 2.25* 2.15*  CALCIUM 8.1* 8.1* 8.0* 8.1* 8.4*   Liver Function Tests: No results for input(s): AST, ALT, ALKPHOS, BILITOT, PROT, ALBUMIN in the last 168 hours. No results for input(s): LIPASE, AMYLASE in the last 168 hours. No results for input(s): AMMONIA in the last 168 hours. CBC:  Recent Labs Lab 10/14/14 0237 10/14/14 0646 10/15/14 0509 10/16/14 0545 10/17/14 0613  WBC  6.4  --  5.9 4.9 5.3  NEUTROABS 4.4  --   --   --   --   HGB 9.2* 9.5* 9.0* 8.6* 9.0*  HCT 29.7* 28.0* 28.1* 27.0* 28.2*  MCV 87.6  --  88.4 88.5 88.4  PLT 181  --  195 188 191   Cardiac Enzymes: No results for input(s): CKTOTAL, CKMB, CKMBINDEX, TROPONINI in the last 168 hours. BNP: BNP (last 3 results) No results for input(s): BNP in the last 8760 hours.  ProBNP (last 3 results) No results for input(s): PROBNP in the last 8760 hours.  CBG:  Recent Labs Lab 10/17/14 1128 10/17/14 1648 10/17/14 2107 10/18/14 0729 10/18/14 1139  GLUCAP 143* 126* 94 82 139*      Signed:  HONGALGI,ANAND, MD, FACP, FHM. [facility] Pager [phone]  If 7PM-7AM, please contact night-coverage [email or web address] Password [facility] 10/18/2014, 3:38 PM

## 2014-11-24 DIAGNOSIS — Z8603 Personal history of neoplasm of uncertain behavior: Secondary | ICD-10-CM

## 2014-11-24 HISTORY — DX: Personal history of neoplasm of uncertain behavior: Z86.03

## 2014-12-14 DIAGNOSIS — I872 Venous insufficiency (chronic) (peripheral): Secondary | ICD-10-CM

## 2014-12-14 DIAGNOSIS — I10 Essential (primary) hypertension: Secondary | ICD-10-CM | POA: Insufficient documentation

## 2014-12-14 HISTORY — DX: Venous insufficiency (chronic) (peripheral): I87.2

## 2015-02-25 DIAGNOSIS — N184 Chronic kidney disease, stage 4 (severe): Secondary | ICD-10-CM | POA: Insufficient documentation

## 2015-02-25 DIAGNOSIS — I890 Lymphedema, not elsewhere classified: Secondary | ICD-10-CM | POA: Insufficient documentation

## 2015-02-25 HISTORY — DX: Lymphedema, not elsewhere classified: I89.0

## 2015-04-04 ENCOUNTER — Inpatient Hospital Stay (HOSPITAL_COMMUNITY)
Admission: EM | Admit: 2015-04-04 | Discharge: 2015-04-11 | DRG: 682 | Disposition: A | Discharge status: Skilled Nursing Facility | Payer: Medicare Other | Attending: Family Medicine | Admitting: Family Medicine

## 2015-04-04 ENCOUNTER — Encounter (HOSPITAL_COMMUNITY): Payer: Self-pay | Admitting: *Deleted

## 2015-04-04 ENCOUNTER — Emergency Department (HOSPITAL_COMMUNITY): Payer: Medicare Other

## 2015-04-04 DIAGNOSIS — L03311 Cellulitis of abdominal wall: Secondary | ICD-10-CM | POA: Diagnosis present

## 2015-04-04 DIAGNOSIS — N179 Acute kidney failure, unspecified: Secondary | ICD-10-CM | POA: Diagnosis present

## 2015-04-04 DIAGNOSIS — G6289 Other specified polyneuropathies: Secondary | ICD-10-CM | POA: Diagnosis present

## 2015-04-04 DIAGNOSIS — B372 Candidiasis of skin and nail: Secondary | ICD-10-CM | POA: Diagnosis present

## 2015-04-04 DIAGNOSIS — T45515A Adverse effect of anticoagulants, initial encounter: Secondary | ICD-10-CM

## 2015-04-04 DIAGNOSIS — Z91013 Allergy to seafood: Secondary | ICD-10-CM | POA: Diagnosis not present

## 2015-04-04 DIAGNOSIS — Z6841 Body Mass Index (BMI) 40.0 and over, adult: Secondary | ICD-10-CM

## 2015-04-04 DIAGNOSIS — I872 Venous insufficiency (chronic) (peripheral): Secondary | ICD-10-CM | POA: Diagnosis present

## 2015-04-04 DIAGNOSIS — I878 Other specified disorders of veins: Secondary | ICD-10-CM | POA: Diagnosis present

## 2015-04-04 DIAGNOSIS — R001 Bradycardia, unspecified: Secondary | ICD-10-CM | POA: Diagnosis present

## 2015-04-04 DIAGNOSIS — E1165 Type 2 diabetes mellitus with hyperglycemia: Secondary | ICD-10-CM | POA: Diagnosis present

## 2015-04-04 DIAGNOSIS — Z794 Long term (current) use of insulin: Secondary | ICD-10-CM

## 2015-04-04 DIAGNOSIS — E1122 Type 2 diabetes mellitus with diabetic chronic kidney disease: Secondary | ICD-10-CM | POA: Diagnosis present

## 2015-04-04 DIAGNOSIS — L03319 Cellulitis of trunk, unspecified: Secondary | ICD-10-CM

## 2015-04-04 DIAGNOSIS — Z885 Allergy status to narcotic agent status: Secondary | ICD-10-CM

## 2015-04-04 DIAGNOSIS — E872 Acidosis: Secondary | ICD-10-CM | POA: Diagnosis present

## 2015-04-04 DIAGNOSIS — L02219 Cutaneous abscess of trunk, unspecified: Secondary | ICD-10-CM | POA: Diagnosis present

## 2015-04-04 DIAGNOSIS — C679 Malignant neoplasm of bladder, unspecified: Secondary | ICD-10-CM | POA: Diagnosis present

## 2015-04-04 DIAGNOSIS — I129 Hypertensive chronic kidney disease with stage 1 through stage 4 chronic kidney disease, or unspecified chronic kidney disease: Secondary | ICD-10-CM | POA: Diagnosis present

## 2015-04-04 DIAGNOSIS — E875 Hyperkalemia: Secondary | ICD-10-CM | POA: Diagnosis present

## 2015-04-04 DIAGNOSIS — D649 Anemia, unspecified: Secondary | ICD-10-CM | POA: Diagnosis present

## 2015-04-04 DIAGNOSIS — N189 Chronic kidney disease, unspecified: Secondary | ICD-10-CM | POA: Diagnosis not present

## 2015-04-04 DIAGNOSIS — Z66 Do not resuscitate: Secondary | ICD-10-CM | POA: Diagnosis not present

## 2015-04-04 DIAGNOSIS — Z9181 History of falling: Secondary | ICD-10-CM | POA: Diagnosis not present

## 2015-04-04 DIAGNOSIS — Z515 Encounter for palliative care: Secondary | ICD-10-CM | POA: Diagnosis present

## 2015-04-04 DIAGNOSIS — D538 Other specified nutritional anemias: Secondary | ICD-10-CM

## 2015-04-04 DIAGNOSIS — Z8673 Personal history of transient ischemic attack (TIA), and cerebral infarction without residual deficits: Secondary | ICD-10-CM

## 2015-04-04 DIAGNOSIS — I959 Hypotension, unspecified: Secondary | ICD-10-CM | POA: Diagnosis present

## 2015-04-04 DIAGNOSIS — Z789 Other specified health status: Secondary | ICD-10-CM | POA: Diagnosis not present

## 2015-04-04 DIAGNOSIS — D6832 Hemorrhagic disorder due to extrinsic circulating anticoagulants: Secondary | ICD-10-CM

## 2015-04-04 DIAGNOSIS — L02211 Cutaneous abscess of abdominal wall: Secondary | ICD-10-CM | POA: Diagnosis present

## 2015-04-04 DIAGNOSIS — R410 Disorientation, unspecified: Secondary | ICD-10-CM

## 2015-04-04 DIAGNOSIS — N183 Chronic kidney disease, stage 3 (moderate): Secondary | ICD-10-CM | POA: Diagnosis present

## 2015-04-04 DIAGNOSIS — R339 Retention of urine, unspecified: Secondary | ICD-10-CM | POA: Diagnosis present

## 2015-04-04 DIAGNOSIS — W19XXXA Unspecified fall, initial encounter: Secondary | ICD-10-CM | POA: Insufficient documentation

## 2015-04-04 DIAGNOSIS — I8311 Varicose veins of right lower extremity with inflammation: Secondary | ICD-10-CM | POA: Diagnosis not present

## 2015-04-04 DIAGNOSIS — J9601 Acute respiratory failure with hypoxia: Secondary | ICD-10-CM | POA: Diagnosis present

## 2015-04-04 DIAGNOSIS — E1151 Type 2 diabetes mellitus with diabetic peripheral angiopathy without gangrene: Secondary | ICD-10-CM | POA: Diagnosis present

## 2015-04-04 DIAGNOSIS — F05 Delirium due to known physiological condition: Secondary | ICD-10-CM | POA: Diagnosis not present

## 2015-04-04 LAB — CBC WITH DIFFERENTIAL/PLATELET
BASOS ABS: 0 10*3/uL (ref 0.0–0.1)
BASOS PCT: 0 %
Basophils Absolute: 0 10*3/uL (ref 0.0–0.1)
Basophils Relative: 0 %
EOS ABS: 0.2 10*3/uL (ref 0.0–0.7)
EOS ABS: 0.2 10*3/uL (ref 0.0–0.7)
EOS PCT: 3 %
EOS PCT: 3 %
HCT: 28.1 % — ABNORMAL LOW (ref 39.0–52.0)
HCT: 27.2 % — ABNORMAL LOW (ref 39.0–52.0)
Hemoglobin: 8.5 g/dL — ABNORMAL LOW (ref 13.0–17.0)
Hemoglobin: 8.2 g/dL — ABNORMAL LOW (ref 13.0–17.0)
LYMPHS ABS: 1.2 10*3/uL (ref 0.7–4.0)
LYMPHS PCT: 20 %
Lymphocytes Relative: 20 %
Lymphs Abs: 1.2 10*3/uL (ref 0.7–4.0)
MCH: 27 pg (ref 26.0–34.0)
MCH: 26.8 pg (ref 26.0–34.0)
MCHC: 30.2 g/dL (ref 30.0–36.0)
MCHC: 30.1 g/dL (ref 30.0–36.0)
MCV: 89.2 fL (ref 78.0–100.0)
MCV: 88.9 fL (ref 78.0–100.0)
MONO ABS: 0.4 10*3/uL (ref 0.1–1.0)
Monocytes Absolute: 0.3 10*3/uL (ref 0.1–1.0)
Monocytes Relative: 6 %
Monocytes Relative: 5 %
Neutro Abs: 4.2 10*3/uL (ref 1.7–7.7)
Neutro Abs: 4.2 10*3/uL (ref 1.7–7.7)
Neutrophils Relative %: 71 %
Neutrophils Relative %: 72 %
PLATELETS: 204 10*3/uL (ref 150–400)
Platelets: 189 10*3/uL (ref 150–400)
RBC: 3.15 MIL/uL — ABNORMAL LOW (ref 4.22–5.81)
RBC: 3.06 MIL/uL — AB (ref 4.22–5.81)
RDW: 16.8 % — AB (ref 11.5–15.5)
RDW: 16.7 % — ABNORMAL HIGH (ref 11.5–15.5)
WBC: 6 10*3/uL (ref 4.0–10.5)
WBC: 5.9 10*3/uL (ref 4.0–10.5)

## 2015-04-04 LAB — URINALYSIS, ROUTINE W REFLEX MICROSCOPIC
BILIRUBIN URINE: NEGATIVE
Glucose, UA: NEGATIVE mg/dL
HGB URINE DIPSTICK: NEGATIVE
Ketones, ur: NEGATIVE mg/dL
Leukocytes, UA: NEGATIVE
NITRITE: NEGATIVE
PROTEIN: NEGATIVE mg/dL
Specific Gravity, Urine: 1.017 (ref 1.005–1.030)
pH: 5 (ref 5.0–8.0)

## 2015-04-04 LAB — BASIC METABOLIC PANEL
ANION GAP: 16 — AB (ref 5–15)
Anion gap: 15 (ref 5–15)
Anion gap: 14 (ref 5–15)
BUN: 77 mg/dL — AB (ref 6–20)
BUN: 78 mg/dL — AB (ref 6–20)
BUN: 73 mg/dL — AB (ref 6–20)
CALCIUM: 8 mg/dL — AB (ref 8.9–10.3)
CALCIUM: 7.7 mg/dL — AB (ref 8.9–10.3)
CHLORIDE: 111 mmol/L (ref 101–111)
CO2: 12 mmol/L — ABNORMAL LOW (ref 22–32)
CO2: 16 mmol/L — ABNORMAL LOW (ref 22–32)
CO2: 18 mmol/L — ABNORMAL LOW (ref 22–32)
CREATININE: 5.57 mg/dL — AB (ref 0.61–1.24)
CREATININE: 5.56 mg/dL — AB (ref 0.61–1.24)
Calcium: 7.9 mg/dL — ABNORMAL LOW (ref 8.9–10.3)
Chloride: 112 mmol/L — ABNORMAL HIGH (ref 101–111)
Chloride: 109 mmol/L (ref 101–111)
Creatinine, Ser: 5.39 mg/dL — ABNORMAL HIGH (ref 0.61–1.24)
GFR calc Af Amer: 11 mL/min — ABNORMAL LOW (ref >60)
GFR calc Af Amer: 11 mL/min — ABNORMAL LOW (ref >60)
GFR, EST AFRICAN AMERICAN: 11 mL/min — AB (ref >60)
GFR, EST NON AFRICAN AMERICAN: 10 mL/min — AB (ref >60)
GFR, EST NON AFRICAN AMERICAN: 9 mL/min — AB (ref >60)
GFR, EST NON AFRICAN AMERICAN: 9 mL/min — AB (ref >60)
GLUCOSE: 159 mg/dL — AB (ref 65–99)
GLUCOSE: 174 mg/dL — AB (ref 65–99)
Glucose, Bld: 229 mg/dL — ABNORMAL HIGH (ref 65–99)
POTASSIUM: 7.2 mmol/L — AB (ref 3.5–5.1)
POTASSIUM: 6.6 mmol/L — AB (ref 3.5–5.1)
SODIUM: 139 mmol/L (ref 135–145)
SODIUM: 143 mmol/L (ref 135–145)
SODIUM: 141 mmol/L (ref 135–145)

## 2015-04-04 LAB — I-STAT ARTERIAL BLOOD GAS, ED
Acid-base deficit: 11 mmol/L — ABNORMAL HIGH (ref 0.0–2.0)
Bicarbonate: 16.3 mEq/L — ABNORMAL LOW (ref 20.0–24.0)
O2 SAT: 92 %
PCO2 ART: 42.1 mmHg (ref 35.0–45.0)
PH ART: 7.196 — AB (ref 7.350–7.450)
TCO2: 18 mmol/L (ref 0–100)
pO2, Arterial: 78 mmHg — ABNORMAL LOW (ref 80.0–100.0)

## 2015-04-04 LAB — HEPATIC FUNCTION PANEL
ALBUMIN: 2.6 g/dL — AB (ref 3.5–5.0)
ALT: 14 U/L — ABNORMAL LOW (ref 17–63)
AST: 13 U/L — AB (ref 15–41)
Alkaline Phosphatase: 80 U/L (ref 38–126)
Bilirubin, Direct: <0.1 mg/dL — ABNORMAL LOW (ref 0.1–0.5)
TOTAL PROTEIN: 6.5 g/dL (ref 6.5–8.1)
Total Bilirubin: 0.2 mg/dL — ABNORMAL LOW (ref 0.3–1.2)

## 2015-04-04 LAB — TYPE AND SCREEN
ABO/RH(D): O POS
ANTIBODY SCREEN: NEGATIVE

## 2015-04-04 LAB — PROTIME-INR
INR: 4.81 — AB (ref 0.00–1.49)
PROTHROMBIN TIME: 43.7 s — AB (ref 11.6–15.2)

## 2015-04-04 LAB — I-STAT CHEM 8, ED
BUN: 77 mg/dL — AB (ref 6–20)
CHLORIDE: 113 mmol/L — AB (ref 101–111)
CREATININE: 5.4 mg/dL — AB (ref 0.61–1.24)
Calcium, Ion: 1.06 mmol/L — ABNORMAL LOW (ref 1.13–1.30)
Glucose, Bld: 203 mg/dL — ABNORMAL HIGH (ref 65–99)
HEMATOCRIT: 27 % — AB (ref 39.0–52.0)
Hemoglobin: 9.2 g/dL — ABNORMAL LOW (ref 13.0–17.0)
Potassium: 7.5 mmol/L (ref 3.5–5.1)
Sodium: 140 mmol/L (ref 135–145)
TCO2: 17 mmol/L (ref 0–100)

## 2015-04-04 LAB — I-STAT TROPONIN, ED: Troponin i, poc: 0 ng/mL (ref 0.00–0.08)

## 2015-04-04 LAB — GLUCOSE, CAPILLARY
GLUCOSE-CAPILLARY: 149 mg/dL — AB (ref 65–99)
GLUCOSE-CAPILLARY: 208 mg/dL — AB (ref 65–99)
Glucose-Capillary: 130 mg/dL — ABNORMAL HIGH (ref 65–99)
Glucose-Capillary: 174 mg/dL — ABNORMAL HIGH (ref 65–99)

## 2015-04-04 LAB — ABO/RH: ABO/RH(D): O POS

## 2015-04-04 LAB — BRAIN NATRIURETIC PEPTIDE: B NATRIURETIC PEPTIDE 5: 147 pg/mL — AB (ref 0.0–100.0)

## 2015-04-04 LAB — MRSA PCR SCREENING: MRSA by PCR: POSITIVE — AB

## 2015-04-04 LAB — CK: CK TOTAL: 186 U/L (ref 49–397)

## 2015-04-04 MED ORDER — DEXTROSE 50 % IV SOLN
1.0000 | Freq: Once | INTRAVENOUS | Status: AC
  Administered 2015-04-04: 50 mL via INTRAVENOUS
  Filled 2015-04-04: qty 50

## 2015-04-04 MED ORDER — SODIUM CHLORIDE 0.9 % IV SOLN
INTRAVENOUS | Status: DC
  Administered 2015-04-04: 17:00:00 via INTRAVENOUS
  Administered 2015-04-05: 10 mL/h via INTRAVENOUS
  Administered 2015-04-06: 12:00:00 via INTRAVENOUS

## 2015-04-04 MED ORDER — FUROSEMIDE 10 MG/ML IJ SOLN
80.0000 mg | Freq: Two times a day (BID) | INTRAMUSCULAR | Status: DC
  Administered 2015-04-04 – 2015-04-05 (×2): 80 mg via INTRAVENOUS
  Filled 2015-04-04 (×3): qty 8

## 2015-04-04 MED ORDER — MUPIROCIN 2 % EX OINT
1.0000 "application " | TOPICAL_OINTMENT | Freq: Two times a day (BID) | CUTANEOUS | Status: AC
  Administered 2015-04-04 – 2015-04-08 (×8): 1 via NASAL
  Filled 2015-04-04 (×3): qty 22

## 2015-04-04 MED ORDER — VANCOMYCIN HCL 10 G IV SOLR: 2000.0000 mg | INTRAVENOUS | Status: DC

## 2015-04-04 MED ORDER — VITAMIN K1 10 MG/ML IJ SOLN
10.0000 mg | Freq: Once | INTRAMUSCULAR | Status: AC
  Administered 2015-04-04: 10 mg via SUBCUTANEOUS
  Filled 2015-04-04: qty 1

## 2015-04-04 MED ORDER — NALOXONE HCL 0.4 MG/ML IJ SOLN
0.4000 mg | Freq: Once | INTRAMUSCULAR | Status: AC
  Administered 2015-04-04: 0.4 mg via INTRAVENOUS
  Filled 2015-04-04: qty 1

## 2015-04-04 MED ORDER — FUROSEMIDE 10 MG/ML IJ SOLN
INTRAMUSCULAR | Status: AC
  Filled 2015-04-04: qty 8

## 2015-04-04 MED ORDER — GLUCAGON HCL RDNA (DIAGNOSTIC) 1 MG IJ SOLR
1.0000 mg | Freq: Once | INTRAMUSCULAR | Status: AC
  Administered 2015-04-04: 1 mg via INTRAVENOUS
  Filled 2015-04-04: qty 1

## 2015-04-04 MED ORDER — SODIUM BICARBONATE 8.4 % IV SOLN
50.0000 meq | Freq: Once | INTRAVENOUS | Status: AC
  Administered 2015-04-04: 50 meq via INTRAVENOUS
  Filled 2015-04-04: qty 50

## 2015-04-04 MED ORDER — VANCOMYCIN HCL 10 G IV SOLR
2500.0000 mg | Freq: Once | INTRAVENOUS | Status: AC
  Administered 2015-04-04: 2500 mg via INTRAVENOUS
  Filled 2015-04-04: qty 2500

## 2015-04-04 MED ORDER — SODIUM CHLORIDE 0.9 % IV BOLUS (SEPSIS)
500.0000 mL | Freq: Once | INTRAVENOUS | Status: AC
  Administered 2015-04-04: 500 mL via INTRAVENOUS

## 2015-04-04 MED ORDER — CHLORHEXIDINE GLUCONATE CLOTH 2 % EX PADS
6.0000 | MEDICATED_PAD | Freq: Every day | CUTANEOUS | Status: AC
  Administered 2015-04-04 – 2015-04-08 (×5): 6 via TOPICAL

## 2015-04-04 MED ORDER — PIPERACILLIN-TAZOBACTAM 3.375 G IVPB 30 MIN
3.3750 g | Freq: Once | INTRAVENOUS | Status: AC
  Administered 2015-04-04: 3.375 g via INTRAVENOUS
  Filled 2015-04-04: qty 50

## 2015-04-04 MED ORDER — PIPERACILLIN-TAZOBACTAM 3.375 G IVPB
3.3750 g | Freq: Three times a day (TID) | INTRAVENOUS | Status: DC
  Filled 2015-04-04: qty 50

## 2015-04-04 MED ORDER — FENTANYL CITRATE (PF) 100 MCG/2ML IJ SOLN: 25.0000 ug | INTRAMUSCULAR | Status: DC | PRN

## 2015-04-04 MED ORDER — SODIUM POLYSTYRENE SULFONATE 15 GM/60ML PO SUSP
60.0000 g | Freq: Once | ORAL | Status: AC
  Administered 2015-04-04: 60 g via ORAL
  Filled 2015-04-04: qty 240

## 2015-04-04 MED ORDER — CALCIUM GLUCONATE 10 % IV SOLN
1.0000 g | Freq: Once | INTRAVENOUS | Status: AC
Start: 2015-04-04 — End: 2015-04-04
  Administered 2015-04-04: 1 g via INTRAVENOUS
  Filled 2015-04-04: qty 10

## 2015-04-04 MED ORDER — STERILE WATER FOR INJECTION IV SOLN
INTRAVENOUS | Status: DC
  Administered 2015-04-04 – 2015-04-06 (×6): via INTRAVENOUS
  Filled 2015-04-04 (×11): qty 850

## 2015-04-04 MED ORDER — PIPERACILLIN-TAZOBACTAM IN DEX 2-0.25 GM/50ML IV SOLN
2.2500 g | Freq: Three times a day (TID) | INTRAVENOUS | Status: DC
  Administered 2015-04-04 – 2015-04-05 (×2): 2.25 g via INTRAVENOUS
  Filled 2015-04-04 (×4): qty 50

## 2015-04-04 MED ORDER — ALBUTEROL SULFATE (2.5 MG/3ML) 0.083% IN NEBU
10.0000 mg | INHALATION_SOLUTION | Freq: Once | RESPIRATORY_TRACT | Status: AC
  Administered 2015-04-04: 10 mg via RESPIRATORY_TRACT

## 2015-04-04 MED ORDER — INSULIN ASPART 100 UNIT/ML IV SOLN
10.0000 [IU] | Freq: Once | INTRAVENOUS | Status: AC
  Administered 2015-04-04: 10 [IU] via INTRAVENOUS
  Filled 2015-04-04: qty 1

## 2015-04-04 NOTE — Consult Note (Addendum)
Renal Service Consult Note [facility]  ASTOR GENTLE 04/04/2015 Roney Jaffe D Requesting Physician:  Dr. Randal Buba  Reason for Consult:  Acute/ CRF  HPI: The patient is a 70 y.o. year-old with hx of morbid obesity, HTN, DM who was last here Aug 2016 with a/chronic renal failure. DC creat was 2.1.  Had L tibial fracture treated conservatively at that time as well, also hyperkalemia/ met acidosis. Today he presents with shortness of breath, and also fell about 36 hours ago. SaO2 88% on 3L [place] per EMS.  On coumadin.   Brought to ED where BP 90's, HR low 48 w junctional bradycardia on EKG.  Creat 5.39, was 2-3 in Aug 2016.  IV placed and receiving IV calc, insulin, glucose. INR up at 4.8, Hb 8.2, plt 204, WBC 5.9. BNP 147. CXR vasc congestion, LLL retrocardiac consolidation/ effusion.   Patient's breathing is a little better.  Voiding w/o any difficulty.  No dysuria or hematuria. No hx kidney stones.      Date  Creat  eGFR Aug 2016  2.1- 3.0 19- 29 Apr 04, 2015 5.39  10  Prior hospital admissions: Aug 2016 - acute on CKD III, DM/ HTN/ morbid obesity > presented with fall/ left tibial fracture, treated conservatively w WBAT. Creat 3.0 improved down to 2.1 at discharge. Was not seen by neph.  Acute hypoxic resp failure, Raliegh Ip, met acidosis treated medically. Anemia , HTN, DM.    ROS  no chest pain  no fevers  no n/v/d  no abd pain  no joint pain  Past Medical History  Past Medical History  Diagnosis Date  . Morbid obesity ([place])   . Diabetes mellitus without complication [facility])    Past Surgical History History reviewed. No pertinent past surgical history. Family History History reviewed. No pertinent family history. Social History  reports that he has never smoked. He has never used smokeless tobacco. He reports that he does not drink alcohol or use illicit drugs. Allergies  Allergies  Allergen Reactions  . Codeine Itching  . Shellfish Allergy Nausea Only   Home  medications Prior to Admission medications   Medication Sig Start Date End Date Taking? Authorizing Provider  allopurinol (ZYLOPRIM) 300 MG tablet Take 300 mg by mouth daily.    Historical Provider, MD  amLODipine (NORVASC) 10 MG tablet Take 10 mg by mouth daily.    Historical Provider, MD  atorvastatin (LIPITOR) 20 MG tablet Take 20 mg by mouth at bedtime.    Historical Provider, MD  cloNIDine (CATAPRES) 0.2 MG tablet Take 0.2 mg by mouth 3 (three) times daily.    Historical Provider, MD  diphenhydrAMINE (BENADRYL) 25 MG tablet Take 25 mg by mouth every 6 (six) hours as needed for itching.    Historical Provider, MD  docusate sodium (COLACE) 100 MG capsule Take 100 mg by mouth 2 (two) times daily.    Historical Provider, MD  ferrous sulfate 325 (65 FE) MG tablet Take 325 mg by mouth daily with breakfast.    Historical Provider, MD  gabapentin (NEURONTIN) 100 MG capsule Take 100 mg by mouth 2 (two) times daily.    Historical Provider, MD  hydrOXYzine (ATARAX/VISTARIL) 25 MG tablet Take 25 mg by mouth 2 (two) times daily.    Historical Provider, MD  insulin aspart (NOVOLOG) 100 UNIT/ML injection Inject 0-9 Units into the skin 3 (three) times daily with meals. CBG < 70: implement hypoglycemia protocol CBG 70 - 120: 0 units CBG 121 - 150: 1 unit  CBG 151 - 200: 2 units CBG 201 - 250: 3 units CBG 251 - 300: 5 units CBG 301 - 350: 7 units CBG 351 - 400: 9 units CBG > 400: call MD. 10/18/14   Modena Jansky, MD  insulin NPH Human (HUMULIN N,NOVOLIN N) 100 UNIT/ML injection Inject 0.55 mLs (55 Units total) into the skin daily before breakfast. 10/18/14   Modena Jansky, MD  insulin NPH Human (HUMULIN N,NOVOLIN N) 100 UNIT/ML injection Inject 0.3 mLs (30 Units total) into the skin daily before supper. 10/18/14   Modena Jansky, MD  levocetirizine (XYZAL) 5 MG tablet Take 5 mg by mouth every evening.    Historical Provider, MD  Nystatin ([place]) 100000 UNIT/GM POWD Apply 1 g topically 2 (two) times  daily.  10/05/14   Historical Provider, MD  omeprazole (PRILOSEC) 20 MG capsule Take 20 mg by mouth daily.    Historical Provider, MD  pramipexole (MIRAPEX) 1 MG tablet Take 1 mg by mouth at bedtime.    Historical Provider, MD  Probiotic CAPS Take 1 capsule by mouth daily.    Historical Provider, MD  SSD 1 % cream Apply 1 application topically daily as needed (sores).  09/20/14   Historical Provider, MD  tamsulosin (FLOMAX) 0.4 MG CAPS capsule Take 0.4 mg by mouth daily.    Historical Provider, MD  tiZANidine (ZANAFLEX) 2 MG tablet Take 2 mg by mouth 2 (two) times daily.    Historical Provider, MD  traMADol (ULTRAM) 50 MG tablet Take 1 tablet (50 mg total) by mouth every 6 (six) hours as needed for moderate pain or severe pain. 10/18/14   Modena Jansky, MD  Triamcinolone Acetonide (TRIAMCINOLONE 0.1 % CREAM : EUCERIN) CREA Apply 1 application topically daily.    Historical Provider, MD  warfarin (COUMADIN) 10 MG tablet Take 5 mg by mouth daily.    Historical Provider, MD   Liver Function Tests  Recent Labs Lab 04/04/15 0231  AST 13*  ALT 14*  ALKPHOS 80  BILITOT 0.2*  PROT 6.5  ALBUMIN 2.6*   No results for input(s): LIPASE, AMYLASE in the last 168 hours. CBC  Recent Labs Lab 04/04/15 0231 04/04/15 0513 04/04/15 0522  WBC 6.0  --  5.9  NEUTROABS 4.2  --  4.2  HGB 8.5* 9.2* 8.2*  HCT 28.1* 27.0* 27.2*  MCV 89.2  --  88.9  PLT 189  --  619   Basic Metabolic Panel  Recent Labs Lab 04/04/15 0231 04/04/15 0513  NA 139 140  K >7.5* 7.5*  CL 111 113*  CO2 12*  --   GLUCOSE 229* 203*  BUN 77* 77*  CREATININE 5.39* 5.40*  CALCIUM 7.9*  --     Filed Vitals:   04/04/15 0225 04/04/15 0226 04/04/15 0439 04/04/15 0613  BP: 110/51 1'10/51 97/46 92/45 '  Pulse: 52 52 48 49  Temp: 97.5 F (36.4 C)     TempSrc: Oral     Resp: '15 15 16 16  ' SpO2: 100% 97% 98% 96%   Exam Morbidly obese, on FM O2, not in distress No rash, cyanosis or gangrene Sclera anicteric, dry mouth No  jvd Chest clear ant and lat Cor RRR no mrg Abd morbidly obese, large pannus w 2+ edema GU normal male Ext 1-2+ edema LE's, brawny skin changes bilat Neuro is alert, diffuse gen weakness  Na 140 K 7.5 BUN 77 Creat 5.39  Ca 7.9 Alb 2.6  LFT's ok  BNP 147 CXR vasc congestion, L  retrocard consolidation  Home meds > allopurinol, norvasc, lipitor, clonidine, neurontin, insulin, prilosec, pramipexole. Flomax, ultram, coumadin  Assessment: 1 Acute on CKD vs progressive CKD - suspect the latter 2 Severe morbid obesity 3 SOB - poss volume overload but difficult exam 4 DM2 5 HTN 6 Volume - has LE/ pannus edema, vasc congestion on CXR, not in resp distress 7 Severe hyperkalemia 8 Junctional bradycardia - due to #7  Plan - patient is not a dialysis candidate due to severe comorbidities.  Volume status difficult to estimate given patient's size, chron edema w prob RHF.  Place foley. Kayexalate if he can tolerate.  Kidney failure is advanced, nothing we can do. Have d/w patient who acknowledged and didn't have any questions. Unable to reach pt's wife.  Would get palliative care involved today. Have d/w CCM.   Kelly Splinter MD [employer] pager [phone]    cell [phone] 04/04/2015, 6:20 AM

## 2015-04-04 NOTE — Consult Note (Addendum)
70 yo with acute on chronic renal failure, abdominal cellulitis, fractured tibia and morbid obesity -admitted following a fall with hypotension, acidosis, hyperkalemia and respiratory distress.  Met with patient and his wife as well as his pastor. They understand severity of his disease and uncertain trajectory with hospital death a possibility. He is DNR but wants to treat the treatable within reason.  1. Diurese for his comfort Lasix 80 BID IV 2. Treat cellulitis for comfort as well- pannus is extremely infected and red/painful-will ask pharmacy to dose Vanc and zosyn 3. Ok to focus on COMFORT and provide treatment within reason. Continue to bring K down if possible. Diurese and treat with antibiotics.   Prognostication is difficult- he is hypotensive acidotic and likely septic from his pannus cellulitis.  I discussed case with Dr. Titus Mould. Ok to transfer off unit and treat conservatively- main issue is that he is not a HD candidate and good possibility his renal function will not improve even to his baseline which is around 3.  Time: 11:30-12:40PM [address] Hacker, [place]

## 2015-04-04 NOTE — Progress Notes (Signed)
Patient ID: Dale Henry, male   DOB: May 17, 1945, 70 y.o.   MRN: [id number] I have had extensive discussions with family wife. We discussed patients current circumstances and organ failures. We also discussed patient's prior wishes under circumstances such as this. Family has decided to NOT perform resuscitation if arrest but to continue current medical support for now. Medical management only No BIPAP role, will aspirate  [name]. Titus Mould, MD, [place] Pgr: [facility]

## 2015-04-04 NOTE — ED Notes (Signed)
Pt arrives via EMS from home. Pt fell 04/02/15, EMS was called out to help pt get up, refused transport. Called EMS again tonight d/t shortness of breath. Pt was 88% on 3L [place]. Placed pt on non-rebreather, came up to 99%. Pt takes coumadin. CBG 205.

## 2015-04-04 NOTE — Progress Notes (Addendum)
CRITICAL VALUE ALERT  Critical value received:  K 7.2  Date of notification:  04/04/15  Time of notification:  1250  Critical value read back: yes  Nurse who received alert: Ines Bloomer  MD notified (1st page):  Dr Titus Mould  Time of first page:  1250  MD notified (2nd page):  Time of second page:  Responding MD: Dr Marry Guan  Time MD responded:  1250

## 2015-04-04 NOTE — ED Notes (Signed)
MD at bedside. 

## 2015-04-04 NOTE — ED Notes (Signed)
Pt 97% on 4L [place]

## 2015-04-04 NOTE — Care Management Note (Addendum)
Case Management Note  Patient Details  Name: Dale Henry MRN: [id number] Date of Birth: October 07, 1945  Subjective/Objective:   Pt admitted with acute on chronic renal failure                 Action/Plan:  Pt is from home with wife; however if pt doesn't respond to medical treatment he will be transitioned to palliative/comfort care.   not a candiate for HD.   Palliative Care is following.    Expected Discharge Date:                  Expected Discharge Plan:   In-House Referral:  Hospice / Palliative Care  Discharge planning Services  CM Consult  Post Acute Care Choice:    Choice offered to:     DME Arranged:    DME Agency:     HH Arranged:    HH Agency:     Status of Service:  In process, will continue to follow  Medicare Important Message Given:    Date Medicare IM Given:    Medicare IM give by:    Date Additional Medicare IM Given:    Additional Medicare Important Message give by:     If discussed at [place] of Stay Meetings, dates discussed:    Additional Comments:  Maryclare Labrador, RN 04/04/2015, 2:48 PM

## 2015-04-04 NOTE — H&P (Addendum)
PULMONARY / CRITICAL CARE MEDICINE   Name: Dale Henry MRN: [id number] DOB: February 20, 1946    ADMISSION DATE:  04/04/2015 CONSULTATION DATE: 04/04/15  REFERRING MD:  EDP  CHIEF COMPLAINT:  Shortness of breath  HISTORY OF PRESENT ILLNESS:   Dale Henry is a 70yo M who called EMS for complaints of shortness of breath. He reportedly fell a few days ago and had called EMS at that time, but refused transport when they arrived. His dyspnea symptoms have been getting worse since the fall. In the ED, labs revealed acute on chronic renal failure with creatinine 5.39 from 2.15 in 09/2014. He has a non-gap metabolic acidosis with HCO3 12. K is markedly elevated at 7.5 and EKG showed sinus bradycardia with widened QRS (131ms). His INR is supratherapeutic. He is awake, alert and conversant.   He reports his fall was from standing height while bathing on Friday, 2/10. His legs just went out from under him. He recovered, he thought, but then became acutely short of breath on Sunday 2/12. He thinks he may have urinated once in the last 24 hours. He denies cough, sputum, nausea, vomiting, diarrhea, dysuria, headache. He is very thirsty.   He lives at home with his wife who helps him with self-care. He does not have an advanced directive or living will. We discussed his current situation and the difficulty we would have resuscitating him were we to lose his pulse. He is thinking about it.   PAST MEDICAL HISTORY :  He  has a past medical history of Morbid obesity ([place]) and Diabetes mellitus without complication ([place]).  PAD reportedly on warfarin Hx TIA Chronic anemia related to renal dysfunction.  Super morbid obesity   PAST SURGICAL HISTORY: He  has no past surgical history on file.  Allergies  Allergen Reactions  . Codeine Itching  . Shellfish Allergy Nausea Only    No current facility-administered medications on file prior to encounter.   Current Outpatient Prescriptions on File Prior to Encounter   Medication Sig  . allopurinol (ZYLOPRIM) 300 MG tablet Take 300 mg by mouth daily.  Marland Kitchen amLODipine (NORVASC) 10 MG tablet Take 10 mg by mouth daily.  Marland Kitchen atorvastatin (LIPITOR) 20 MG tablet Take 20 mg by mouth at bedtime.  . cloNIDine (CATAPRES) 0.2 MG tablet Take 0.2 mg by mouth 3 (three) times daily.  . diphenhydrAMINE (BENADRYL) 25 MG tablet Take 25 mg by mouth every 6 (six) hours as needed for itching.  . docusate sodium (COLACE) 100 MG capsule Take 100 mg by mouth 2 (two) times daily.  . ferrous sulfate 325 (65 FE) MG tablet Take 325 mg by mouth daily with breakfast.  . gabapentin (NEURONTIN) 100 MG capsule Take 100 mg by mouth 2 (two) times daily.  . hydrOXYzine (ATARAX/VISTARIL) 25 MG tablet Take 25 mg by mouth 2 (two) times daily.  . insulin aspart (NOVOLOG) 100 UNIT/ML injection Inject 0-9 Units into the skin 3 (three) times daily with meals. CBG < 70: implement hypoglycemia protocol CBG 70 - 120: 0 units CBG 121 - 150: 1 unit CBG 151 - 200: 2 units CBG 201 - 250: 3 units CBG 251 - 300: 5 units CBG 301 - 350: 7 units CBG 351 - 400: 9 units CBG > 400: call MD.  . insulin NPH Human (HUMULIN N,NOVOLIN N) 100 UNIT/ML injection Inject 0.55 mLs (55 Units total) into the skin daily before breakfast.  . insulin NPH Human (HUMULIN N,NOVOLIN N) 100 UNIT/ML injection Inject 0.3 mLs (30 Units  total) into the skin daily before supper.  . levocetirizine (XYZAL) 5 MG tablet Take 5 mg by mouth every evening.  Marland Kitchen Nystatin (NYAMYC) 100000 UNIT/GM POWD Apply 1 g topically 2 (two) times daily.   Marland Kitchen omeprazole (PRILOSEC) 20 MG capsule Take 20 mg by mouth daily.  . pramipexole (MIRAPEX) 1 MG tablet Take 1 mg by mouth at bedtime.  . Probiotic CAPS Take 1 capsule by mouth daily.  Marland Kitchen SSD 1 % cream Apply 1 application topically daily as needed (sores).   . tamsulosin (FLOMAX) 0.4 MG CAPS capsule Take 0.4 mg by mouth daily.  Marland Kitchen tiZANidine (ZANAFLEX) 2 MG tablet Take 2 mg by mouth 2 (two) times daily.  .  traMADol (ULTRAM) 50 MG tablet Take 1 tablet (50 mg total) by mouth every 6 (six) hours as needed for moderate pain or severe pain.  . Triamcinolone Acetonide (TRIAMCINOLONE 0.1 % CREAM : EUCERIN) CREA Apply 1 application topically daily.  Marland Kitchen warfarin (COUMADIN) 10 MG tablet Take 5 mg by mouth daily.    FAMILY HISTORY:  His has no family status information on file.   SOCIAL HISTORY: He  reports that he has never smoked. He has never used smokeless tobacco. He reports that he does not drink alcohol or use illicit drugs.  REVIEW OF SYSTEMS:     VITAL SIGNS: BP 97/46 mmHg  Pulse 48  Temp(Src) 97.5 F (36.4 C) (Oral)  Resp 16  SpO2 98%  HEMODYNAMICS:    VENTILATOR SETTINGS:    INTAKE / OUTPUT:    PHYSICAL EXAMINATION: General:  Well-developed super morbidly obese white male, mild distress.  Neuro:  Awake, alert, oriented, following commands. Non-focal.  HEENT:  No gross abnormalities. Dry mucous membranes. Cardiovascular: bradycardic @ 50. Distant heart tones. No audible rub / murmur / gallop.  Lungs:  Distant breath sounds, but no audible wheezes, rales or ronchi on anterior exam. Symmetrical expansion.  Abdomen: obese with large pendulous pannus with areas of cellulitis with body wall edema. +bowel sounds. Unable to palpate organs.  Musculoskeletal:  Limited range of motion, no gross deformities Skin: ecchymotic areas R flank, panniculitis/stasis dermatitis lower portion of pannus L>R, erythema of L groin suggestive of fungal process, bilateral lower extremities in [place] boots toes to knees; no drainage.   LABS:  BMET  Recent Labs Lab 04/04/15 0231 04/04/15 0513  NA 139 140  K >7.5* 7.5*  CL 111 113*  CO2 12*  --   BUN 77* 77*  CREATININE 5.39* 5.40*  GLUCOSE 229* 203*    Electrolytes  Recent Labs Lab 04/04/15 0231  CALCIUM 7.9*    CBC  Recent Labs Lab 04/04/15 0231 04/04/15 0513 04/04/15 0522  WBC 6.0  --  5.9  HGB 8.5* 9.2* 8.2*  HCT 28.1*  27.0* 27.2*  PLT 189  --  204    Coag's  Recent Labs Lab 04/04/15 0231  INR 4.81*    Sepsis Markers No results for input(s): LATICACIDVEN, PROCALCITON, O2SATVEN in the last 168 hours.  ABG No results for input(s): PHART, PCO2ART, PO2ART in the last 168 hours.  Liver Enzymes  Recent Labs Lab 04/04/15 0231  AST 13*  ALT 14*  ALKPHOS 80  BILITOT 0.2*  ALBUMIN 2.6*    Cardiac Enzymes No results for input(s): TROPONINI, PROBNP in the last 168 hours.  Glucose No results for input(s): GLUCAP in the last 168 hours.  Imaging Dg Chest Portable 1 View  04/04/2015  CLINICAL DATA:  [place] tonight, shortness of breath. History of  diabetes. EXAM: PORTABLE CHEST 1 VIEW COMPARISON:  Chest radiograph December 14, 2014 FINDINGS: Patient is rotated LEFT. The cardiac silhouette appears mildly enlarged, calcified aortic knob. Pulmonary vascular congestion. Retrocardiac consolidation with small LEFT pleural effusion. Strandy densities RIGHT lung base. No pneumothorax. Large body habitus. Osseous structures are nonsuspicious. Surgical clips project in LEFT upper quadrant. IMPRESSION: Mild cardiomegaly and pulmonary vascular congestion. Retrocardiac consolidation and small LEFT pleural effusion. RIGHT lung base atelectasis. Electronically Signed   By: Elon Alas M.D.   On: 04/04/2015 04:13     STUDIES:  CT head and neck pending  CULTURES: None   ANTIBIOTICS: None  SIGNIFICANT EVENTS: Discussion with Dr. Jonnie Finner of nephrology - patient NOT a dialysis candidate.   LINES/TUBES: PIV  DISCUSSION: Mr. Kruckeberg is an unfortunate 20M whose medical care is limited by his super-morbid obesity. He has known stage III CKD and now has superimposed acute renal failure complicated by marked hyperkalemia with bradycardia and widened QRS on EKG. Temporizing measures are being attempted, but his prognosis is exceedingly poor without dialysis and he is NOT a dialysis candidate (see Dr. Barkley Bruns  note). Other medical issues include supratherapeutic INR and concern for occult bleed after recent fall with Hgb 8.2. His anemia may simply reflect volume overload in the setting of renal failure as his baseline in 09/2014 appeared to be around 9. He is hypotensive by cuff pressures, but it is not clear how accurate these are, as his mental status remains quite good.   ASSESSMENT / PLAN:  PULMONARY A: Acute hypoxemic respiratory failure on 4L [place] P:   Continue supplemental O2 for sats >92% Wean oxygen as tolerated Consider trial of diuretics if okay with nephrology  CARDIOVASCULAR A:  Hypotension  Bradycardia with widened QRS Hyperkalemia Bilateral venous stasis w/ edema P:  Treat hyperkalemia Telemetry Will likely need A-line for accurate pressure monitoring  RENAL A:   Acute on chronic (ckd III) renal failure Metabolic acidosis  P:   Temporize K as needed with calcium, glucose and insulin, bicarb, albuterol  Kayexelate ordered Initiate bicarb gtt 3 amps in sterile water @ 250cc  Abdominal/renal U/S pending Nephrology following - appreciate their help Trend BMP q6h  GASTROINTESTINAL A:   No acute issues P:   NPO except for meds / ice chips  HEMATOLOGIC A:   Acute on chronic anemia; likely secondary to chronic kidney disease Hgb 8.2 from 9 in 09/2014 Doubt acute bleeding Supratherapeutic INR (on warfarin for peripheral arterial disease) P:  Trend CBC Abdominal u/s Unable to obtain CT S/p [place] vit K in ed Hold warfarin  INFECTIOUS A:   No acute s/s of bacterial infection P:  Monitor  ENDOCRINE A:   Diabetes mellitus type II, poorly controlled P:   Insulin given Accuchecks SSI  NEUROLOGIC A:   Awake, alert, oriented, appropriate P:   RASS goal: 0   FAMILY  - Updates: Spoke with wife, Surveyor, quantity by phone. She states her husband would never want to be on life support and that she thinks he would want to be a DNR in this situation.   -  Inter-disciplinary family meet or Palliative Care meeting due by:  day 7  The patient is critically ill with multiple organ system failure and requires high complexity decision making for assessment and support, frequent evaluation and titration of therapies, advanced monitoring, review of radiographic studies and interpretation of complex data.   Critical Care Time devoted to patient care services, exclusive of separately billable procedures, described in  this note is 75 minutes.   Dannielle Burn, MD Pulmonary and [place] Pager: [phone]  04/04/2015, 5:57 AM

## 2015-04-04 NOTE — ED Provider Notes (Signed)
CSN: [id number]     Arrival date & time 04/04/15  0219 History  By signing my name below, I, Dale Henry, attest that this documentation has been prepared under the direction and in the presence of [name], MD. Electronically Signed: Altamease Henry, ED Scribe. 04/04/2015. 2:49 AM   Chief Complaint  Patient presents with  . Shortness of Breath    Patient is a 70 y.o. male presenting with shortness of breath. The history is provided by the EMS personnel. No language interpreter was used.  Shortness of Breath Severity:  Severe Onset quality:  Gradual Duration:  36 hours Timing:  Constant Progression:  Worsening Chronicity:  New Relieved by:  Nothing Worsened by:  Nothing tried Ineffective treatments:  None tried Associated symptoms: no chest pain, no cough and no fever   Risk factors: obesity    Brought in by EMS, Dale Henry is a 70 y.o. male with PMHx of DM and morbid obesity  who presents to the Emergency Department complaining of new, constant, and worsening SOB with onset nearly 36 hours ago after a fall. Initially after the fall the pt called EMS but refused transport.  Past Medical History  Diagnosis Date  . Morbid obesity ([place])   . Diabetes mellitus without complication ([place])    History reviewed. No pertinent past surgical history. No family history on file. Social History  Substance Use Topics  . Smoking status: Never Smoker   . Smokeless tobacco: Never Used  . Alcohol Use: No    Review of Systems  Constitutional: Negative for fever.  Respiratory: Positive for shortness of breath. Negative for cough.   Cardiovascular: Negative for chest pain.  All other systems reviewed and are negative.  Allergies  Codeine and Shellfish allergy  Home Medications   Prior to Admission medications   Medication Sig Start Date End Date Taking? Authorizing Provider  allopurinol (ZYLOPRIM) 300 MG tablet Take 300 mg by mouth daily.    Historical Provider, MD   amLODipine (NORVASC) 10 MG tablet Take 10 mg by mouth daily.    Historical Provider, MD  atorvastatin (LIPITOR) 20 MG tablet Take 20 mg by mouth at bedtime.    Historical Provider, MD  cloNIDine (CATAPRES) 0.2 MG tablet Take 0.2 mg by mouth 3 (three) times daily.    Historical Provider, MD  diphenhydrAMINE (BENADRYL) 25 MG tablet Take 25 mg by mouth every 6 (six) hours as needed for itching.    Historical Provider, MD  docusate sodium (COLACE) 100 MG capsule Take 100 mg by mouth 2 (two) times daily.    Historical Provider, MD  ferrous sulfate 325 (65 FE) MG tablet Take 325 mg by mouth daily with breakfast.    Historical Provider, MD  gabapentin (NEURONTIN) 100 MG capsule Take 100 mg by mouth 2 (two) times daily.    Historical Provider, MD  hydrOXYzine (ATARAX/VISTARIL) 25 MG tablet Take 25 mg by mouth 2 (two) times daily.    Historical Provider, MD  insulin aspart (NOVOLOG) 100 UNIT/ML injection Inject 0-9 Units into the skin 3 (three) times daily with meals. CBG < 70: implement hypoglycemia protocol CBG 70 - 120: 0 units CBG 121 - 150: 1 unit CBG 151 - 200: 2 units CBG 201 - 250: 3 units CBG 251 - 300: 5 units CBG 301 - 350: 7 units CBG 351 - 400: 9 units CBG > 400: call MD. 10/18/14   Modena Jansky, MD  insulin NPH Human (HUMULIN N,NOVOLIN N) 100 UNIT/ML injection Inject 0.55  mLs (55 Units total) into the skin daily before breakfast. 10/18/14   Modena Jansky, MD  insulin NPH Human (HUMULIN N,NOVOLIN N) 100 UNIT/ML injection Inject 0.3 mLs (30 Units total) into the skin daily before supper. 10/18/14   Modena Jansky, MD  levocetirizine (XYZAL) 5 MG tablet Take 5 mg by mouth every evening.    Historical Provider, MD  Nystatin ([place]) 100000 UNIT/GM POWD Apply 1 g topically 2 (two) times daily.  10/05/14   Historical Provider, MD  omeprazole (PRILOSEC) 20 MG capsule Take 20 mg by mouth daily.    Historical Provider, MD  pramipexole (MIRAPEX) 1 MG tablet Take 1 mg by mouth at bedtime.     Historical Provider, MD  Probiotic CAPS Take 1 capsule by mouth daily.    Historical Provider, MD  SSD 1 % cream Apply 1 application topically daily as needed (sores).  09/20/14   Historical Provider, MD  tamsulosin (FLOMAX) 0.4 MG CAPS capsule Take 0.4 mg by mouth daily.    Historical Provider, MD  tiZANidine (ZANAFLEX) 2 MG tablet Take 2 mg by mouth 2 (two) times daily.    Historical Provider, MD  traMADol (ULTRAM) 50 MG tablet Take 1 tablet (50 mg total) by mouth every 6 (six) hours as needed for moderate pain or severe pain. 10/18/14   Modena Jansky, MD  Triamcinolone Acetonide (TRIAMCINOLONE 0.1 % CREAM : EUCERIN) CREA Apply 1 application topically daily.    Historical Provider, MD  warfarin (COUMADIN) 10 MG tablet Take 5 mg by mouth daily.    Historical Provider, MD   BP 110/51 mmHg  Pulse 52  Temp(Src) 97.5 F (36.4 C) (Oral)  Resp 15  SpO2 100% Physical Exam  Constitutional: He appears well-developed and well-nourished.  HENT:  Head: Normocephalic.  Mouth/Throat: Oropharynx is clear and moist.  Moist mucous membranes No exudate No battle sign No racoon eyes No hemotympanum bilaterally  Eyes: Pupils are equal, round, and reactive to light.  Neck: Normal range of motion.  Trachea midline No bruit  Cardiovascular: Normal rate and regular rhythm.   Radial pulses intact DP pulses equivalent on BLEs   Pulmonary/Chest: Effort normal. No stridor. No respiratory distress. He has no wheezes. He has no rhonchi. He has no rales.  Slightly diminished breath sounds  Abdominal: Soft. Bowel sounds are normal. He exhibits no mass. There is no tenderness. There is no rebound and no guarding.  Bruising on the abdomen   Musculoskeletal: He exhibits edema.  No step offs of the cervical spine Edema of BLEs  Neurological: He is alert. He has normal reflexes.  Skin: Skin is warm and dry.  Psychiatric: He has a normal mood and affect. His behavior is normal.  Nursing note and vitals  reviewed.   ED Course  Procedures (including critical care time)  COORDINATION OF CARE: 2:24 AM Discussed treatment plan which includes lab work and EKG with pt at bedside and pt agreed to plan.  Labs Review Labs Reviewed  CBC WITH DIFFERENTIAL/PLATELET - Abnormal; Notable for the following:    RBC 3.15 (*)    Hemoglobin 8.5 (*)    HCT 28.1 (*)    RDW 16.8 (*)    All other components within normal limits  PROTIME-INR  HEPATIC FUNCTION PANEL  BRAIN NATRIURETIC PEPTIDE  I-STAT CHEM 8, ED  I-STAT TROPOININ, ED    Imaging Review No results found. I have personally reviewed and evaluated these images and lab results as part of my medical decision-making.  EKG Interpretation None      MDM   Final diagnoses:  None     EKG Interpretation  Date/Time:  Monday April 04 2015 02:26:27 EST Ventricular Rate:  52 PR Interval:    QRS Duration: 172 QT Interval:  475 QTC Calculation: 442 R Axis:   -58 Text Interpretation:  Junctional rhythm RBBB and LAFB Confirmed by [facility]  MD, Daneisha Surges ([id number]) on 04/04/2015 3:56:04 AM       Results for orders placed or performed during the hospital encounter of 04/04/15  CBC with Differential/Platelet  Result Value Ref Range   WBC 6.0 4.0 - 10.5 K/uL   RBC 3.15 (L) 4.22 - 5.81 MIL/uL   Hemoglobin 8.5 (L) 13.0 - 17.0 g/dL   HCT 28.1 (L) 39.0 - 52.0 %   MCV 89.2 78.0 - 100.0 fL   MCH 27.0 26.0 - 34.0 pg   MCHC 30.2 30.0 - 36.0 g/dL   RDW 16.8 (H) 11.5 - 15.5 %   Platelets 189 150 - 400 K/uL   Neutrophils Relative % 71 %   Neutro Abs 4.2 1.7 - 7.7 K/uL   Lymphocytes Relative 20 %   Lymphs Abs 1.2 0.7 - 4.0 K/uL   Monocytes Relative 6 %   Monocytes Absolute 0.4 0.1 - 1.0 K/uL   Eosinophils Relative 3 %   Eosinophils Absolute 0.2 0.0 - 0.7 K/uL   Basophils Relative 0 %   Basophils Absolute 0.0 0.0 - 0.1 K/uL  Protime-INR  Result Value Ref Range   Prothrombin Time 43.7 (H) 11.6 - 15.2 seconds   INR 4.81 (H) 0.00 - 1.49   Hepatic function panel  Result Value Ref Range   Total Protein 6.5 6.5 - 8.1 g/dL   Albumin 2.6 (L) 3.5 - 5.0 g/dL   AST 13 (L) 15 - 41 U/L   ALT 14 (L) 17 - 63 U/L   Alkaline Phosphatase 80 38 - 126 U/L   Total Bilirubin 0.2 (L) 0.3 - 1.2 mg/dL   Bilirubin, Direct <0.1 (L) 0.1 - 0.5 mg/dL   Indirect Bilirubin NOT CALCULATED 0.3 - 0.9 mg/dL  Brain natriuretic peptide  Result Value Ref Range   B Natriuretic Peptide 147.0 (H) 0.0 - 100.0 pg/mL  Basic metabolic panel  Result Value Ref Range   Sodium 139 135 - 145 mmol/L   Potassium >7.5 (HH) 3.5 - 5.1 mmol/L   Chloride 111 101 - 111 mmol/L   CO2 12 (L) 22 - 32 mmol/L   Glucose, Bld 229 (H) 65 - 99 mg/dL   BUN 77 (H) 6 - 20 mg/dL   Creatinine, Ser 5.39 (H) 0.61 - 1.24 mg/dL   Calcium 7.9 (L) 8.9 - 10.3 mg/dL   GFR calc non Af Amer 10 (L) >60 mL/min   GFR calc Af Amer 11 (L) >60 mL/min   Anion gap 16 (H) 5 - 15  CBC with Differential/Platelet  Result Value Ref Range   WBC 5.9 4.0 - 10.5 K/uL   RBC 3.06 (L) 4.22 - 5.81 MIL/uL   Hemoglobin 8.2 (L) 13.0 - 17.0 g/dL   HCT 27.2 (L) 39.0 - 52.0 %   MCV 88.9 78.0 - 100.0 fL   MCH 26.8 26.0 - 34.0 pg   MCHC 30.1 30.0 - 36.0 g/dL   RDW 16.7 (H) 11.5 - 15.5 %   Platelets 204 150 - 400 K/uL   Neutrophils Relative % 72 %   Neutro Abs 4.2 1.7 - 7.7 K/uL   Lymphocytes Relative 20 %  Lymphs Abs 1.2 0.7 - 4.0 K/uL   Monocytes Relative 5 %   Monocytes Absolute 0.3 0.1 - 1.0 K/uL   Eosinophils Relative 3 %   Eosinophils Absolute 0.2 0.0 - 0.7 K/uL   Basophils Relative 0 %   Basophils Absolute 0.0 0.0 - 0.1 K/uL  CK  Result Value Ref Range   Total CK 186 49 - 397 U/L  I-stat chem 8, ed  Result Value Ref Range   Sodium 140 135 - 145 mmol/L   Potassium 7.5 (HH) 3.5 - 5.1 mmol/L   Chloride 113 (H) 101 - 111 mmol/L   BUN 77 (H) 6 - 20 mg/dL   Creatinine, Ser 5.40 (H) 0.61 - 1.24 mg/dL   Glucose, Bld 203 (H) 65 - 99 mg/dL   Calcium, Ion 1.06 (L) 1.13 - 1.30 mmol/L   TCO2 17 0 - 100  mmol/L   Hemoglobin 9.2 (L) 13.0 - 17.0 g/dL   HCT 27.0 (L) 39.0 - 52.0 %   Comment NOTIFIED PHYSICIAN   I-stat troponin, ED  Result Value Ref Range   Troponin i, poc 0.00 0.00 - 0.08 ng/mL   Comment 3          I-Stat arterial blood gas, ED  Result Value Ref Range   pH, Arterial 7.196 (LL) 7.350 - 7.450   pCO2 arterial 42.1 35.0 - 45.0 mmHg   pO2, Arterial 78.0 (L) 80.0 - 100.0 mmHg   Bicarbonate 16.3 (L) 20.0 - 24.0 mEq/L   TCO2 18 0 - 100 mmol/L   O2 Saturation 92.0 %   Acid-base deficit 11.0 (H) 0.0 - 2.0 mmol/L   Patient temperature 98.6 F    Collection site RADIAL, ALLEN'S TEST ACCEPTABLE    Drawn by Operator    Sample type ARTERIAL   Type and screen  Result Value Ref Range   ABO/RH(D) O POS    Antibody Screen NEG    Sample Expiration 04/07/2015   ABO/Rh  Result Value Ref Range   ABO/RH(D) O POS    Ct Head Wo Contrast  04/04/2015  CLINICAL DATA:  Fall 36 hours ago. New shortness of breath after the fall. EXAM: CT HEAD WITHOUT CONTRAST CT CERVICAL SPINE WITHOUT CONTRAST TECHNIQUE: Multidetector CT imaging of the head and cervical spine was performed following the standard protocol without intravenous contrast. Multiplanar CT image reconstructions of the cervical spine were also generated. COMPARISON:  None. FINDINGS: CT HEAD FINDINGS Diffuse cerebral atrophy. No ventricular dilatation. Patchy low-attenuation change in the deep white matter consistent with small vessel ischemia. No mass effect or midline shift. No abnormal extra-axial fluid collections. Gray-white matter junctions are distinct. Basal cisterns are not effaced. No evidence of acute intracranial hemorrhage. No depressed skull fractures. Mild mucosal thickening in the paranasal sinuses. No acute air-fluid levels. Mastoid air cells are not opacified. Vascular calcifications. CT CERVICAL SPINE FINDINGS Examination is technically limited due to motion artifact. Straightening of the usual cervical lordosis. This appearance  is nonspecific and may be due to patient positioning or degenerative change but ligamentous injury or muscle spasm could also have this appearance inner not excluded. Diffuse degenerative changes throughout the cervical spine with narrowed cervical interspaces and associated endplate hypertrophic changes. Degenerative changes throughout the cervical facet joints. No anterior subluxation. No vertebral compression deformities. No prevertebral soft tissue swelling. C1-2 articulation appears intact. Vascular calcifications. IMPRESSION: No acute intracranial abnormalities. Chronic atrophy and small vessel ischemic changes. Nonspecific straightening of usual cervical lordosis. Diffuse degenerative changes in the cervical spine. No  acute displaced fractures identified. Electronically Signed   By: Lucienne Capers M.D.   On: 04/04/2015 06:38   Ct Cervical Spine Wo Contrast  04/04/2015  CLINICAL DATA:  Fall 36 hours ago. New shortness of breath after the fall. EXAM: CT HEAD WITHOUT CONTRAST CT CERVICAL SPINE WITHOUT CONTRAST TECHNIQUE: Multidetector CT imaging of the head and cervical spine was performed following the standard protocol without intravenous contrast. Multiplanar CT image reconstructions of the cervical spine were also generated. COMPARISON:  None. FINDINGS: CT HEAD FINDINGS Diffuse cerebral atrophy. No ventricular dilatation. Patchy low-attenuation change in the deep white matter consistent with small vessel ischemia. No mass effect or midline shift. No abnormal extra-axial fluid collections. Gray-white matter junctions are distinct. Basal cisterns are not effaced. No evidence of acute intracranial hemorrhage. No depressed skull fractures. Mild mucosal thickening in the paranasal sinuses. No acute air-fluid levels. Mastoid air cells are not opacified. Vascular calcifications. CT CERVICAL SPINE FINDINGS Examination is technically limited due to motion artifact. Straightening of the usual cervical lordosis.  This appearance is nonspecific and may be due to patient positioning or degenerative change but ligamentous injury or muscle spasm could also have this appearance inner not excluded. Diffuse degenerative changes throughout the cervical spine with narrowed cervical interspaces and associated endplate hypertrophic changes. Degenerative changes throughout the cervical facet joints. No anterior subluxation. No vertebral compression deformities. No prevertebral soft tissue swelling. C1-2 articulation appears intact. Vascular calcifications. IMPRESSION: No acute intracranial abnormalities. Chronic atrophy and small vessel ischemic changes. Nonspecific straightening of usual cervical lordosis. Diffuse degenerative changes in the cervical spine. No acute displaced fractures identified. Electronically Signed   By: Lucienne Capers M.D.   On: 04/04/2015 06:38   Dg Chest Portable 1 View  04/04/2015  CLINICAL DATA:  [place] tonight, shortness of breath. History of diabetes. EXAM: PORTABLE CHEST 1 VIEW COMPARISON:  Chest radiograph December 14, 2014 FINDINGS: Patient is rotated LEFT. The cardiac silhouette appears mildly enlarged, calcified aortic knob. Pulmonary vascular congestion. Retrocardiac consolidation with small LEFT pleural effusion. Strandy densities RIGHT lung base. No pneumothorax. Large body habitus. Osseous structures are nonsuspicious. Surgical clips project in LEFT upper quadrant. IMPRESSION: Mild cardiomegaly and pulmonary vascular congestion. Retrocardiac consolidation and small LEFT pleural effusion. RIGHT lung base atelectasis. Electronically Signed   By: Elon Alas M.D.   On: 04/04/2015 04:13    Medications  sodium polystyrene (KAYEXALATE) 15 GM/60ML suspension 60 g (not administered)  sodium bicarbonate injection 50 mEq (not administered)  naloxone (NARCAN) injection 0.4 mg (0.4 mg Intravenous Given 04/04/15 0456)  glucagon (human recombinant) (GLUCAGEN) injection 1 mg (1 mg Intravenous Given  04/04/15 0503)  sodium chloride 0.9 % bolus 500 mL (0 mLs Intravenous Stopped 04/04/15 0635)  calcium gluconate inj 10% (1 g) URGENT USE ONLY! (1 g Intravenous Given 04/04/15 0522)  albuterol (PROVENTIL) (2.5 MG/3ML) 0.083% nebulizer solution 10 mg (10 mg Nebulization Given 04/04/15 0623)  insulin aspart (novoLOG) injection 10 Units (10 Units Intravenous Given 04/04/15 0522)  dextrose 50 % solution 50 mL (50 mLs Intravenous Given 04/04/15 0522)  phytonadione (VITAMIN K) SQ injection 10 mg (10 mg Subcutaneous Given 04/04/15 0609)   MDM Reviewed: previous chart, nursing note and vitals Reviewed previous: labs and ECG Interpretation: ECG, labs, x-ray and CT scan (no rib fractures by me on cxr no SDH by me on head ct acute renal failure and hyperkalemia and metabolic acidosis by me on labs) Total time providing critical care: > 105 minutes. This excludes time spent performing separately reportable procedures  and services. Consults: critical care and trauma (nephrology)    CRITICAL CARE Performed by: Carlisle Beers Total critical care time: 120  minutes Critical care time was exclusive of separately billable procedures and treating other patients. Critical care was necessary to treat or prevent imminent or life-threatening deterioration. Critical care was time spent personally by me on the following activities: development of treatment plan with patient and/or surrogate as well as nursing, discussions with consultants, evaluation of patient's response to treatment, examination of patient, obtaining history from patient or surrogate, ordering and performing treatments and interventions, ordering and review of laboratory studies, ordering and review of radiographic studies, pulse oximetry and re-evaluation of patient's condition.  I personally performed the services described in this documentation, which was scribed in my presence. The recorded information has been reviewed and is accurate.     Veatrice Kells, MD 04/04/15 [phone]

## 2015-04-04 NOTE — Progress Notes (Signed)
Pharmacy Antibiotic Note  Dale Henry is a 70 y.o. male admitted on 04/04/2015 with cellulitis.  Pharmacy has been consulted for vancomycin and zosyn dosing.  Plan: Vancomycin 2500 mg load x 1 followed by vancomycin 2,000 mg IV q48h Zosyn 3.375 gm IV x 1 (30 min infusion) followed by 2.25 gm IV q8h F/u LOT, comfort care plans   Height: 5\' 5"  (165.1 cm) Weight: (!) 426 lb (193.232 kg) IBW/kg (Calculated) : 61.5  Temp (24hrs), Avg:97.5 F (36.4 C), Min:97.4 F (36.3 C), Max:97.6 F (36.4 C)   Recent Labs Lab 04/04/15 0231 04/04/15 0513 04/04/15 0522 04/04/15 1148  WBC 6.0  --  5.9  --   CREATININE 5.39* 5.40*  --  5.57*    Estimated Creatinine Clearance: 19.6 mL/min (by C-G formula based on Cr of 5.57).    Allergies  Allergen Reactions  . Codeine Itching  . Shellfish Allergy Nausea Only    Antimicrobials this admission: Vanc 2/13 >> Zosyn 2/13 >>  Microbiology results: 2/13 BCx: sent 2/13 MRSA PCR +  Thank you for allowing pharmacy to be a part of this patient's care.  Cassie L. Nicole Kindred, PharmD PGY2 Infectious Diseases Pharmacy Resident Pager: [phone] 04/04/2015 2:24 PM

## 2015-04-04 NOTE — ED Notes (Signed)
Pt transported to ultrasound.

## 2015-04-04 NOTE — ED Notes (Signed)
Admitting physician and respiratory at bedside.

## 2015-04-04 NOTE — Progress Notes (Signed)
UR Completed. Latysha Thackston, RN, BSN.  [phone] 

## 2015-04-05 DIAGNOSIS — W19XXXA Unspecified fall, initial encounter: Secondary | ICD-10-CM

## 2015-04-05 LAB — BASIC METABOLIC PANEL
Anion gap: 14 (ref 5–15)
BUN: 72 mg/dL — AB (ref 6–20)
CO2: 19 mmol/L — ABNORMAL LOW (ref 22–32)
Calcium: 7.6 mg/dL — ABNORMAL LOW (ref 8.9–10.3)
Chloride: 110 mmol/L (ref 101–111)
Creatinine, Ser: 5.37 mg/dL — ABNORMAL HIGH (ref 0.61–1.24)
GFR calc Af Amer: 11 mL/min — ABNORMAL LOW (ref >60)
GFR, EST NON AFRICAN AMERICAN: 10 mL/min — AB (ref >60)
GLUCOSE: 143 mg/dL — AB (ref 65–99)
POTASSIUM: 6.4 mmol/L — AB (ref 3.5–5.1)
Sodium: 143 mmol/L (ref 135–145)

## 2015-04-05 LAB — PROTIME-INR
INR: 2.61 — AB (ref 0.00–1.49)
PROTHROMBIN TIME: 27.5 s — AB (ref 11.6–15.2)

## 2015-04-05 LAB — RENAL FUNCTION PANEL
ANION GAP: 12 (ref 5–15)
Albumin: 2.1 g/dL — ABNORMAL LOW (ref 3.5–5.0)
BUN: 65 mg/dL — ABNORMAL HIGH (ref 6–20)
CHLORIDE: 108 mmol/L (ref 101–111)
CO2: 25 mmol/L (ref 22–32)
CREATININE: 4.76 mg/dL — AB (ref 0.61–1.24)
Calcium: 7.5 mg/dL — ABNORMAL LOW (ref 8.9–10.3)
GFR, EST AFRICAN AMERICAN: 13 mL/min — AB (ref >60)
GFR, EST NON AFRICAN AMERICAN: 11 mL/min — AB (ref >60)
Glucose, Bld: 133 mg/dL — ABNORMAL HIGH (ref 65–99)
POTASSIUM: 5.8 mmol/L — AB (ref 3.5–5.1)
Phosphorus: 6.4 mg/dL — ABNORMAL HIGH (ref 2.5–4.6)
Sodium: 145 mmol/L (ref 135–145)

## 2015-04-05 LAB — GLUCOSE, CAPILLARY: Glucose-Capillary: 129 mg/dL — ABNORMAL HIGH (ref 65–99)

## 2015-04-05 MED ORDER — FUROSEMIDE 10 MG/ML IJ SOLN
40.0000 mg | Freq: Two times a day (BID) | INTRAMUSCULAR | Status: DC
  Administered 2015-04-05: 40 mg via INTRAVENOUS

## 2015-04-05 MED ORDER — NYSTATIN 100000 UNIT/GM EX POWD
Freq: Three times a day (TID) | CUTANEOUS | Status: DC
  Administered 2015-04-05 – 2015-04-11 (×16): via TOPICAL
  Filled 2015-04-05 (×3): qty 15

## 2015-04-05 MED ORDER — SODIUM POLYSTYRENE SULFONATE 15 GM/60ML PO SUSP
60.0000 g | Freq: Once | ORAL | Status: AC
  Administered 2015-04-05: 60 g via ORAL
  Filled 2015-04-05: qty 240

## 2015-04-05 MED ORDER — SENNA 8.6 MG PO TABS
1.0000 | ORAL_TABLET | Freq: Every evening | ORAL | Status: DC | PRN
Start: 2015-04-05 — End: 2015-04-11
  Administered 2015-04-11: 8.6 mg via ORAL
  Filled 2015-04-05 (×2): qty 1

## 2015-04-05 MED ORDER — HALOPERIDOL LACTATE 5 MG/ML IJ SOLN
1.0000 mg | INTRAMUSCULAR | Status: DC | PRN
  Administered 2015-04-10: 1 mg via INTRAVENOUS
  Filled 2015-04-05: qty 1

## 2015-04-05 MED ORDER — FUROSEMIDE 10 MG/ML IJ SOLN
40.0000 mg | Freq: Every day | INTRAMUSCULAR | Status: DC
  Administered 2015-04-06 – 2015-04-07 (×2): 40 mg via INTRAVENOUS
  Filled 2015-04-05 (×2): qty 4

## 2015-04-05 MED ORDER — DIPHENHYDRAMINE HCL 25 MG PO CAPS
25.0000 mg | ORAL_CAPSULE | Freq: Three times a day (TID) | ORAL | Status: DC | PRN
  Administered 2015-04-05 – 2015-04-11 (×8): 25 mg via ORAL
  Filled 2015-04-05 (×8): qty 1

## 2015-04-05 NOTE — Care Management Note (Signed)
Case Management Note  Patient Details  Name: Dale Henry MRN: [id number] Date of Birth: 08-08-1945  Subjective/Objective:   Pt admitted with acute on chronic renal failure                 Action/Plan:   04/05/2015 Pt responding to treatments.  Pt has transfer orders off ICU unit.  Unit CM will continue to monitor for disposition needs  04/04/15 Pt is from home with wife; however if pt doesn't respond to medical treatment he will be transitioned to palliative/comfort care.   not a candiate for HD.   Palliative Care is following.    Expected Discharge Date:                  Expected Discharge Plan:   In-House Referral:  Hospice / Palliative Care  Discharge planning Services  CM Consult  Post Acute Care Choice:    Choice offered to:     DME Arranged:    DME Agency:     HH Arranged:    HH Agency:     Status of Service:  In process, will continue to follow  Medicare Important Message Given:    Date Medicare IM Given:    Medicare IM give by:    Date Additional Medicare IM Given:    Additional Medicare Important Message give by:     If discussed at [place] of Stay Meetings, dates discussed:    Additional Comments:  Maryclare Labrador, RN 04/05/2015, 3:30 PM

## 2015-04-05 NOTE — Progress Notes (Signed)
eLink Physician-Brief Progress Note Patient Name: Dale Henry DOB: [birth year] MRN: [id number]   Date of Service  04/05/2015  HPI/Events of Note  C/O of itching of lower extremities  eICU Interventions  Benadryl 25 mg po q8 hours prn     Intervention Category Minor Interventions: Routine modifications to care plan (e.g. PRN medications for pain, fever)  Dale Henry 04/05/2015, 4:30 AM

## 2015-04-05 NOTE — H&P (Addendum)
PULMONARY / CRITICAL CARE MEDICINE   Name: Dale Henry MRN: [id number] DOB: Jul 04, 1945    ADMISSION DATE:  04/04/2015 CONSULTATION DATE: 04/04/15  REFERRING MD:  EDP  CHIEF COMPLAINT:  Shortness of breath  HISTORY OF PRESENT ILLNESS:   Dale Henry is a 71yo M who called EMS for complaints of shortness of breath. He reportedly fell a few days ago and had called EMS at that time, but refused transport when they arrived. His dyspnea symptoms have been getting worse since the fall. In the ED, labs revealed acute on chronic renal failure with creatinine 5.39 from 2.15 in 09/2014. He has a non-gap metabolic acidosis with HCO3 12. K is markedly elevated at 7.5 and EKG showed sinus bradycardia with widened QRS (164ms). His INR is supratherapeutic. He is awake, alert and conversant.   Subjective, awake,m no distress, no longer brady  VITAL SIGNS: BP 136/57 mmHg  Pulse 77  Temp(Src) 99.2 F (37.3 C) (Oral)  Resp 15  Ht 5\' 5"  (1.651 m)  Wt 427 lb (193.686 kg)  BMI 71.06 kg/m2  SpO2 96%  HEMODYNAMICS:    VENTILATOR SETTINGS:    INTAKE / OUTPUT: I/O last 3 completed shifts: In: 5099 [P.O.:350; I.V.:4149; IV [id number] Out: [id number]; Stool:400]  PHYSICAL EXAMINATION: General:  Well-developed super morbidly obese white male, no distress Neuro:  Awake, alert, oriented, following commands. Non-focal.  HEENT:  No gross abnormalities. Dry mucous membranes. Cardiovascular: brady resolved, distant s 1 s2 RR Lungs:  reduced Abdomen: obese with large pendulous pannus with areas of cellulitis with body wall edema. +bowel sounds. Unable to palpate organs.  Musculoskeletal:  Limited range of motion, no gross deformities Skin: ecchymotic areas R flank, panniculitis/stasis dermatitis lower portion of pannus L>R, erythema of L groin suggestive of fungal process, bilateral lower extremities in Un[place] boots toes to knees; no drainage.   LABS:  BMET  Recent Labs Lab 04/04/15 1148  04/04/15 1805 04/04/15 2334  NA 143 141 143  K 7.2* 6.6* 6.4*  CL 112* 109 110  CO2 16* 18* 19*  BUN 78* 73* 72*  CREATININE 5.57* 5.56* 5.37*  GLUCOSE 159* 174* 143*    Electrolytes  Recent Labs Lab 04/04/15 1148 04/04/15 1805 04/04/15 2334  CALCIUM 8.0* 7.7* 7.6*    CBC  Recent Labs Lab 04/04/15 0231 04/04/15 0513 04/04/15 0522  WBC 6.0  --  5.9  HGB 8.5* 9.2* 8.2*  HCT 28.1* 27.0* 27.2*  PLT 189  --  204    Coag's  Recent Labs Lab 04/04/15 0231  INR 4.81*    Sepsis Markers No results for input(s): LATICACIDVEN, PROCALCITON, O2SATVEN in the last 168 hours.  ABG  Recent Labs Lab 04/04/15 0630  PHART 7.196*  PCO2ART 42.1  PO2ART 78.0*    Liver Enzymes  Recent Labs Lab 04/04/15 0231  AST 13*  ALT 14*  ALKPHOS 80  BILITOT 0.2*  ALBUMIN 2.6*    Cardiac Enzymes No results for input(s): TROPONINI, PROBNP in the last 168 hours.  Glucose  Recent Labs Lab 04/04/15 0836 04/04/15 1221 04/04/15 1550 04/04/15 1959 04/05/15 0001  GLUCAP 149* 130* 174* 208* 129*    Imaging No results found.   STUDIES:  CT head and neck>>>neg, djd neck  SIGNIFICANT EVENTS: Discussion with Dr. Jonnie Finner of nephrology - patient NOT a dialysis candidate.   LINES/TUBES: PIV  DISCUSSION: Dale Henry is an unfortunate 58M whose medical care is limited by his super-morbid obesity. He has known stage III CKD and now has superimposed  acute renal failure complicated by marked hyperkalemia with bradycardia and widened QRS on EKG. Temporizing measures are being attempted, but his prognosis is exceedingly poor without dialysis and he is NOT a dialysis candidate (see Dr. Barkley Bruns note). Other medical issues include supratherapeutic INR and concern for occult bleed after recent fall with Hgb 8.2. His anemia may simply reflect volume overload in the setting of renal failure as his baseline in 09/2014 appeared to be around 9. He is hypotensive by cuff pressures, but it is  not clear how accurate these are, as his mental status remains quite good.   ASSESSMENT / PLAN:  PULMONARY A: Acute hypoxemic respiratory improved P:   Continue supplemental O2 for sats >92% Wean oxygen as tolerated Lasix improved   CARDIOVASCULAR A:  Hypotension  Bradycardia with widened QRS - from K resolved Hyperkalemia Bilateral venous stasis w/ edema P:  Treat hyperkalemia best we can Dc tele No role pressors per wife also  RENAL A:   Acute on chronic (ckd III) renal failure Hyperkalemia, ARF on top CRI Metabolic acidosis  P:   Repeat kaylate today Keep bicarb but to 50 cc/hr bmet in am  Lasix maintain   GASTROINTESTINAL A:   Npo P:   NPO except for meds / ice chips Start fulls  HEMATOLOGIC A:   Acute on chronic anemia; likely secondary to chronic kidney disease Hgb 8.2 from 9 in 09/2014 Doubt acute bleeding Supratherapeutic INR (on warfarin for peripheral arterial disease) P:  Hold coumadin Get coags in am   INFECTIOUS A:   Nopanus cellultis - CHRONIC P:  Zosyn dc Dc vanc  ENDOCRINE A:   Diabetes mellitus type II, poorly controlled P:   Accuchecks SSI  NEUROLOGIC A:   Awake, alert, oriented, appropriate P:   RASS goal: 0  Dnr, dni, no presors Treating best can medically To [facility], med floor  Raylene Miyamoto., MD Pulmonary and Critical Care Medicine [facility] Pager: [phone]  04/05/2015, 11:17 AM

## 2015-04-05 NOTE — Progress Notes (Signed)
R upper arm IV infiltrated, IV removed.  At time of infiltration NS was infusing, however, pt did have sodium bicarb infusing through this IV approx 6 hours prior to infiltration.  Pt's upper arm appears reddened, inflamed and bruised around site of IV.  Pt denies discomfort.  Pharmacist recommends placing warm packs around site of infiltration.  Oncoming RN notified.

## 2015-04-05 NOTE — Progress Notes (Signed)
Palliative Care Daily Progress Note   S: Medical record reviewed, followed up with RN Vaughan Basta regarding patient's condition. Discussed changes with Dr. Douglass Rivers; new orders rec'd based on patient's changes.  B: Code Status: DNR  Advanced Directives: Does patient have an advance directive?: No  Family/Support: No family present  14 Plan of Care Goal is, for Palliative Care, symptom management with comfort as goal; treat the treatable. Pending transfer to floor when bed is available.  A: Patient's current condition: in bed. Patient and nurse report that pt is confused when waking up, but he is easily redirected; this is new since yesterday. He is unable to carry on much more than very basic conversation. When asked about complaints/problems, pt states "I'd be ok if I could just get out from under this." When this topic is explored he states that he needs to get out from under "that machine with the switches" while looking around at the room. Pt makes eye contact when prompted but otherwise eyes dart around the room, then he repeats that he would be ok if he "could just get out from under this"; phrasing is exactly repeated. He is able to recall meeting me yesterday but cannot remember conversations held during this encounter. Family is not present. Discharge Planning: Per primary MD  R: Support offered at this time.  Palliative Care Team will continue to follow patient.   Larina Earthly

## 2015-04-05 NOTE — Progress Notes (Signed)
Assessment: 1 Acute on CKD vs progressive CKD - suspect the latter 2 Severe morbid obesity 3 SOB - poss volume overload but difficult exam 4 DM2 5 HTN 6 Volume - has LE/ pannus edema, vasc congestion on CXR, not in resp distress 7 Severe hyperkalemia 8 Junctional bradycardia - due to #7  Plan - prn kaexylate; reduce diuretic dose  Subjective: Interval History: 4875 cc UOP last 24  Objective: Vital signs in last 24 hours: Temp:  [98.1 F (36.7 C)-99.9 F (37.7 C)] 99.9 F (37.7 C) (02/14 1159) Pulse Rate:  [48-84] 77 (02/14 1100) Resp:  [10-23] 15 (02/14 1100) BP: (65-149)/(31-105) 136/57 mmHg (02/14 1100) SpO2:  [94 %-98 %] 96 % (02/14 1100) Weight:  [193.686 kg (427 lb)] 193.686 kg (427 lb) (02/14 0500) Weight change:   Intake/Output from previous day: 02/13 0701 - 02/14 0700 In: 4599 [P.O.:350; I.V.:3649; IV Piggyback:600] Out: [id number]; Stool:400] Intake/Output this shift: Total I/O In: 900 [P.O.:300; I.V.:600] Out: 3625 [Urine:3625]  General appearance: alert and confused Resp: clear anteriorly Chest wall: no tenderness GI: protuberant Extremities: wrapped bilat  Lab Results:  Recent Labs  04/04/15 0231 04/04/15 0513 04/04/15 0522  WBC 6.0  --  5.9  HGB 8.5* 9.2* 8.2*  HCT 28.1* 27.0* 27.2*  PLT 189  --  204   BMET:  Recent Labs  04/04/15 1805 04/04/15 2334  NA 141 143  K 6.6* 6.4*  CL 109 110  CO2 18* 19*  GLUCOSE 174* 143*  BUN 73* 72*  CREATININE 5.56* 5.37*  CALCIUM 7.7* 7.6*   No results for input(s): PTH in the last 72 hours. Iron Studies: No results for input(s): IRON, TIBC, TRANSFERRIN, FERRITIN in the last 72 hours. Studies/Results: Ct Head Wo Contrast  04/04/2015  CLINICAL DATA:  Fall 36 hours ago. New shortness of breath after the fall. EXAM: CT HEAD WITHOUT CONTRAST CT CERVICAL SPINE WITHOUT CONTRAST TECHNIQUE: Multidetector CT imaging of the head and cervical spine was performed following the standard protocol without  intravenous contrast. Multiplanar CT image reconstructions of the cervical spine were also generated. COMPARISON:  None. FINDINGS: CT HEAD FINDINGS Diffuse cerebral atrophy. No ventricular dilatation. Patchy low-attenuation change in the deep white matter consistent with small vessel ischemia. No mass effect or midline shift. No abnormal extra-axial fluid collections. Gray-white matter junctions are distinct. Basal cisterns are not effaced. No evidence of acute intracranial hemorrhage. No depressed skull fractures. Mild mucosal thickening in the paranasal sinuses. No acute air-fluid levels. Mastoid air cells are not opacified. Vascular calcifications. CT CERVICAL SPINE FINDINGS Examination is technically limited due to motion artifact. Straightening of the usual cervical lordosis. This appearance is nonspecific and may be due to patient positioning or degenerative change but ligamentous injury or muscle spasm could also have this appearance inner not excluded. Diffuse degenerative changes throughout the cervical spine with narrowed cervical interspaces and associated endplate hypertrophic changes. Degenerative changes throughout the cervical facet joints. No anterior subluxation. No vertebral compression deformities. No prevertebral soft tissue swelling. C1-2 articulation appears intact. Vascular calcifications. IMPRESSION: No acute intracranial abnormalities. Chronic atrophy and small vessel ischemic changes. Nonspecific straightening of usual cervical lordosis. Diffuse degenerative changes in the cervical spine. No acute displaced fractures identified. Electronically Signed   By: Lucienne Capers M.D.   On: 04/04/2015 06:38   Ct Cervical Spine Wo Contrast  04/04/2015  CLINICAL DATA:  Fall 36 hours ago. New shortness of breath after the fall. EXAM: CT HEAD WITHOUT CONTRAST CT CERVICAL SPINE WITHOUT CONTRAST TECHNIQUE: Multidetector  CT imaging of the head and cervical spine was performed following the standard  protocol without intravenous contrast. Multiplanar CT image reconstructions of the cervical spine were also generated. COMPARISON:  None. FINDINGS: CT HEAD FINDINGS Diffuse cerebral atrophy. No ventricular dilatation. Patchy low-attenuation change in the deep white matter consistent with small vessel ischemia. No mass effect or midline shift. No abnormal extra-axial fluid collections. Gray-white matter junctions are distinct. Basal cisterns are not effaced. No evidence of acute intracranial hemorrhage. No depressed skull fractures. Mild mucosal thickening in the paranasal sinuses. No acute air-fluid levels. Mastoid air cells are not opacified. Vascular calcifications. CT CERVICAL SPINE FINDINGS Examination is technically limited due to motion artifact. Straightening of the usual cervical lordosis. This appearance is nonspecific and may be due to patient positioning or degenerative change but ligamentous injury or muscle spasm could also have this appearance inner not excluded. Diffuse degenerative changes throughout the cervical spine with narrowed cervical interspaces and associated endplate hypertrophic changes. Degenerative changes throughout the cervical facet joints. No anterior subluxation. No vertebral compression deformities. No prevertebral soft tissue swelling. C1-2 articulation appears intact. Vascular calcifications. IMPRESSION: No acute intracranial abnormalities. Chronic atrophy and small vessel ischemic changes. Nonspecific straightening of usual cervical lordosis. Diffuse degenerative changes in the cervical spine. No acute displaced fractures identified. Electronically Signed   By: Lucienne Capers M.D.   On: 04/04/2015 06:38   US Abdomen Complete  04/04/2015  CLINICAL DATA:  Fall, morbid obesity EXAM: ABDOMEN ULTRASOUND COMPLETE COMPARISON:  CT 12/15/2014 FINDINGS: Gallbladder: No gallstones or wall thickening visualized. No sonographic Murphy sign noted by sonographer.The gallbladder is  partially collapsed. Common bile duct: Diameter: Normal diameter 5.3 mm. Liver: No focal lesion identified. Within normal limits in parenchymal echogenicity. IVC: No abnormality visualized. Pancreas: Visualized portion unremarkable. Spleen: Not well visualized due to body habitus Right Kidney: Length: 11.6.  No hydronephrosis Left Kidney: Length: Not visualized. Abdominal aorta: No aneurysm visualized. Other findings: None. IMPRESSION: 1. Limited exam due to patient body habitus. The spleen is not identified. 2. No acute findings in the abdomen identified. Electronically Signed   By: Suzy Bouchard M.D.   On: 04/04/2015 07:19   Dg Chest Portable 1 View  04/04/2015  CLINICAL DATA:  [place] tonight, shortness of breath. History of diabetes. EXAM: PORTABLE CHEST 1 VIEW COMPARISON:  Chest radiograph December 14, 2014 FINDINGS: Patient is rotated LEFT. The cardiac silhouette appears mildly enlarged, calcified aortic knob. Pulmonary vascular congestion. Retrocardiac consolidation with small LEFT pleural effusion. Strandy densities RIGHT lung base. No pneumothorax. Large body habitus. Osseous structures are nonsuspicious. Surgical clips project in LEFT upper quadrant. IMPRESSION: Mild cardiomegaly and pulmonary vascular congestion. Retrocardiac consolidation and small LEFT pleural effusion. RIGHT lung base atelectasis. Electronically Signed   By: Elon Alas M.D.   On: 04/04/2015 04:13    Scheduled: . Chlorhexidine Gluconate Cloth  6 each Topical Q0600  . furosemide  80 mg Intravenous Q12H  . mupirocin ointment  1 application Nasal BID  . sodium polystyrene  60 g Oral Once     LOS: 1 day   Dale Henry C 04/05/2015,12:38 PM

## 2015-04-06 DIAGNOSIS — L02219 Cutaneous abscess of trunk, unspecified: Secondary | ICD-10-CM

## 2015-04-06 DIAGNOSIS — L03319 Cellulitis of trunk, unspecified: Secondary | ICD-10-CM

## 2015-04-06 LAB — BASIC METABOLIC PANEL
Anion gap: 12 (ref 5–15)
BUN: 56 mg/dL — AB (ref 6–20)
CALCIUM: 7.5 mg/dL — AB (ref 8.9–10.3)
CO2: 25 mmol/L (ref 22–32)
CREATININE: 4.25 mg/dL — AB (ref 0.61–1.24)
Chloride: 107 mmol/L (ref 101–111)
GFR calc non Af Amer: 13 mL/min — ABNORMAL LOW (ref >60)
GFR, EST AFRICAN AMERICAN: 15 mL/min — AB (ref >60)
GLUCOSE: 159 mg/dL — AB (ref 65–99)
Potassium: 5.3 mmol/L — ABNORMAL HIGH (ref 3.5–5.1)
Sodium: 144 mmol/L (ref 135–145)

## 2015-04-06 LAB — PROTIME-INR
INR: 2.04 — ABNORMAL HIGH (ref 0.00–1.49)
Prothrombin Time: 22.9 seconds — ABNORMAL HIGH (ref 11.6–15.2)

## 2015-04-06 LAB — APTT: aPTT: 46 seconds — ABNORMAL HIGH (ref 24–37)

## 2015-04-06 LAB — GLUCOSE, CAPILLARY: Glucose-Capillary: 205 mg/dL — ABNORMAL HIGH (ref 65–99)

## 2015-04-06 MED ORDER — HYDROCERIN EX CREA
1.0000 "application " | TOPICAL_CREAM | Freq: Every day | CUTANEOUS | Status: DC
  Administered 2015-04-06 – 2015-04-11 (×6): 1 via TOPICAL
  Filled 2015-04-06: qty 113

## 2015-04-06 MED ORDER — HYDRALAZINE HCL 20 MG/ML IJ SOLN
5.0000 mg | Freq: Once | INTRAMUSCULAR | Status: AC
  Administered 2015-04-07: 5 mg via INTRAVENOUS
  Filled 2015-04-06: qty 1

## 2015-04-06 MED ORDER — HYDROCERIN EX CREA: TOPICAL_CREAM | Freq: Two times a day (BID) | CUTANEOUS | Status: DC

## 2015-04-06 NOTE — Progress Notes (Signed)
[facility] PROGRESS NOTE  Dale Henry [id number] DOB: May 20, 1945 DOA: 04/04/2015 PCP: PROVIDER NOT IN Henry   Discussion from H and P: Mr. Dale Henry is an unfortunate 1M whose medical care is limited by his super-morbid obesity. He has known stage III CKD and now has superimposed acute renal failure complicated by marked hyperkalemia with bradycardia and widened QRS on EKG. Temporizing measures are being attempted, but his prognosis is exceedingly poor without dialysis and he is NOT a dialysis candidate (see Dr. Barkley Henry note). Other medical issues include supratherapeutic INR and concern for occult bleed after recent fall with Hgb 8.2. His anemia may simply reflect volume overload in the setting of renal failure as his baseline in 09/2014 appeared to be around 9. He is hypotensive by cuff pressures, but it is not clear how accurate these are, as his mental status remains quite good.   Assessment/Plan: Active Problems:   Acute on chronic renal failure (HCC) with metabolic acidosis - Nephrology on board and managing - Palliative on board  Hypotension  - resolved - Palliative on board   Hyperkalemia - trending down  Acute hypoxemic respiratory status - continue oxygen as tolerated. Suspected worsening secondary to worsening kidney function. Currently breathing status improved   Code Status: DO NOT RESUSCITATE Family Communication: Discussed directly with patient Disposition Plan: Pending improvement in condition   Consultants:  Palliative care  Nephrology  Procedures:  None  Antibiotics:  None  HPI/Subjective: Patient none complaints. No acute issues overnight. States that his breathing is improved  Objective: Filed Vitals:   04/06/15 0517 04/06/15 1000  BP: 152/52 142/45  Pulse: 74 72  Temp: 98.1 F (36.7 C) 98.3 F (36.8 C)  Resp: 18 18    Intake/Output Summary (Last 24 hours) at 04/06/15 1536 Last data filed at 04/06/15 1300  Gross per 24  hour  Intake    740 ml  Output   6100 ml  Net  -5360 ml   Filed Weights   04/04/15 0831 04/05/15 0500  Weight: 193.232 kg (426 lb) 193.686 kg (427 lb)    Exam:   General:  Patient in no acute distress, alert and awake  Cardiovascular: Regular rate and rhythm, no rubs  Respiratory: Equal chest rise, nasal cannula in place, no increased work of breathing  Abdomen: Soft, obese, nontender  Musculoskeletal: No clubbing   Data Reviewed: Basic Metabolic Panel:  Recent Labs Lab 04/04/15 1148 04/04/15 1805 04/04/15 2334 04/05/15 1222 04/06/15 0815  NA 143 141 143 145 144  K 7.2* 6.6* 6.4* 5.8* 5.3*  CL 112* 109 110 108 107  CO2 16* 18* 19* 25 25  GLUCOSE 159* 174* 143* 133* 159*  BUN 78* 73* 72* 65* 56*  CREATININE 5.57* 5.56* 5.37* 4.76* 4.25*  CALCIUM 8.0* 7.7* 7.6* 7.5* 7.5*  PHOS  --   --   --  6.4*  --    Liver Function Tests:  Recent Labs Lab 04/04/15 0231 04/05/15 1222  AST 13*  --   ALT 14*  --   ALKPHOS 80  --   BILITOT 0.2*  --   PROT 6.5  --   ALBUMIN 2.6* 2.1*   No results for input(s): LIPASE, AMYLASE in the last 168 hours. No results for input(s): AMMONIA in the last 168 hours. CBC:  Recent Labs Lab 04/04/15 0231 04/04/15 0513 04/04/15 0522  WBC 6.0  --  5.9  NEUTROABS 4.2  --  4.2  HGB 8.5* 9.2* 8.2*  HCT 28.1* 27.0* 27.2*  MCV 89.2  --  88.9  PLT 189  --  204   Cardiac Enzymes:  Recent Labs Lab 04/04/15 0521  CKTOTAL 186   BNP (last 3 results)  Recent Labs  04/04/15 0231  BNP 147.0*    ProBNP (last 3 results) No results for input(s): PROBNP in the last 8760 hours.  CBG:  Recent Labs Lab 04/04/15 0836 04/04/15 1221 04/04/15 1550 04/04/15 1959 04/05/15 0001  GLUCAP 149* 130* 174* 208* 129*    Recent Results (from the past 240 hour(s))  MRSA PCR Screening     Status: Abnormal   Collection Time: 04/04/15  8:51 AM  Result Value Ref Range Status   MRSA by PCR POSITIVE (A) NEGATIVE Final    Comment:        The  GeneXpert MRSA Assay (FDA approved for NASAL specimens only), is one component of a comprehensive MRSA colonization surveillance program. It is not intended to diagnose MRSA infection nor to guide or monitor treatment for MRSA infections. RESULT CALLED TO, READ BACK BY AND VERIFIED WITH: L. BASS RN 11:30 04/04/15 ([email or web address])   Culture, blood (routine x 2)     Status: None (Preliminary result)   Collection Time: 04/04/15  2:24 PM  Result Value Ref Range Status   Specimen Description BLOOD RIGHT HAND  Final   Special Requests BOTTLES DRAWN AEROBIC ONLY 3CC  Final   Culture NO GROWTH 2 DAYS  Final   Report Status PENDING  Incomplete  Culture, blood (routine x 2)     Status: None (Preliminary result)   Collection Time: 04/04/15  2:26 PM  Result Value Ref Range Status   Specimen Description BLOOD RIGHT HAND  Final   Special Requests BOTTLES DRAWN AEROBIC ONLY 3CC  Final   Culture NO GROWTH 2 DAYS  Final   Report Status PENDING  Incomplete     Studies: No results found.  Scheduled Meds: . Chlorhexidine Gluconate Cloth  6 each Topical Q0600  . furosemide  40 mg Intravenous Daily  . hydrocerin  1 application Topical Daily  . mupirocin ointment  1 application Nasal BID  . nystatin   Topical TID   Continuous Infusions: . sodium chloride 10 mL/hr at 04/06/15 1214  .  sodium bicarbonate 150 mEq in sterile water 1000 mL infusion 50 mL/hr at 04/06/15 1214    Time spent: > 35 minutes   Dale Henry  [facility] Pager [phone] If 7PM-7AM, please contact night-coverage at [email or web address], password Dale Henry 04/06/2015, 3:36 PM  LOS: 2 days

## 2015-04-06 NOTE — Progress Notes (Signed)
Palliative Medicine RN Note: Wife at bedside; discussed patient with RN. RN, patient and family report that symptoms are well-controlled. Patient is very happy that he is "getting real food" instead of liquid diet. Pt remains in contact isolation. WOCN has been consulted for legs and abdomen. RN reports that pt told her redness on his abdomen is d/t care at [facility]; abdominal cellulitis was present on admit and is being treated. Plan f/u tomorrow by PM team.  Larina Earthly, RN, BSN, [facility] 04/06/2015 12:54 PM

## 2015-04-06 NOTE — Consult Note (Addendum)
WOC wound consult note Reason for Consult: Consult requested for pannus folds, right knee, right arm, and BLE.   Wound type: Bilat legs without open wounds or drainage. Some dry scales remove easily and are located across both legs, feet, and toes. Appearance consistent with venous stasis skin changes.   Abd folds in pannus areas are red, macerated, and weeping mod amt yellow drainage.  Appearance consistent with intertrigo. Right outer knee with partial thickness abrasion from a fall; 3X3X.1cm, red and moist, no door or drainage. Right arm with multiple areas of erythremia and bruising but no open wounds requiring topical treatment. Dressing procedure/placement/frequency: Foam dressing to promote healing to right knee.  Eucerin cream to assist with removal of loose skin to BLE.   Interdry silver-impregnated fabric ordered for use by bedside nurses and instructions provided for pannus areas.  This product should remain in place for 5 days for optimal plan of care to provide antimicrobial benefits and wick moisture away from skin.   Discussed plan of care with patient and he denies further questions  Please re-consult if further assistance is needed.  Thank-you,  Julien Girt MSN, [place], [place], [place], [place]

## 2015-04-07 DIAGNOSIS — R410 Disorientation, unspecified: Secondary | ICD-10-CM

## 2015-04-07 DIAGNOSIS — Z789 Other specified health status: Secondary | ICD-10-CM

## 2015-04-07 DIAGNOSIS — C679 Malignant neoplasm of bladder, unspecified: Secondary | ICD-10-CM

## 2015-04-07 DIAGNOSIS — I872 Venous insufficiency (chronic) (peripheral): Secondary | ICD-10-CM

## 2015-04-07 LAB — GLUCOSE, CAPILLARY: Glucose-Capillary: 232 mg/dL — ABNORMAL HIGH (ref 65–99)

## 2015-04-07 LAB — BASIC METABOLIC PANEL
Anion gap: 11 (ref 5–15)
BUN: 47 mg/dL — AB (ref 6–20)
CALCIUM: 8 mg/dL — AB (ref 8.9–10.3)
CHLORIDE: 104 mmol/L (ref 101–111)
CO2: 25 mmol/L (ref 22–32)
CREATININE: 3.53 mg/dL — AB (ref 0.61–1.24)
GFR calc non Af Amer: 16 mL/min — ABNORMAL LOW (ref >60)
GFR, EST AFRICAN AMERICAN: 19 mL/min — AB (ref >60)
Glucose, Bld: 244 mg/dL — ABNORMAL HIGH (ref 65–99)
Potassium: 5.3 mmol/L — ABNORMAL HIGH (ref 3.5–5.1)
SODIUM: 140 mmol/L (ref 135–145)

## 2015-04-07 MED ORDER — CAMPHOR-MENTHOL 0.5-0.5 % EX LOTN
TOPICAL_LOTION | CUTANEOUS | Status: DC | PRN
  Administered 2015-04-07 – 2015-04-10 (×3): via TOPICAL
  Filled 2015-04-07 (×2): qty 222

## 2015-04-07 MED ORDER — FUROSEMIDE 40 MG PO TABS
40.0000 mg | ORAL_TABLET | Freq: Every day | ORAL | Status: DC
  Filled 2015-04-07: qty 1

## 2015-04-07 MED ORDER — FLUCONAZOLE IN SODIUM CHLORIDE 100-0.9 MG/50ML-% IV SOLN
100.0000 mg | INTRAVENOUS | Status: DC
  Administered 2015-04-08 – 2015-04-09 (×2): 100 mg via INTRAVENOUS
  Filled 2015-04-07 (×3): qty 50

## 2015-04-07 MED ORDER — HYDROCODONE-ACETAMINOPHEN 10-325 MG PO TABS
1.0000 | ORAL_TABLET | ORAL | Status: DC | PRN
  Administered 2015-04-08: 1 via ORAL
  Filled 2015-04-07: qty 1

## 2015-04-07 MED ORDER — FLUCONAZOLE IN SODIUM CHLORIDE 200-0.9 MG/100ML-% IV SOLN
200.0000 mg | Freq: Once | INTRAVENOUS | Status: AC
  Administered 2015-04-07: 200 mg via INTRAVENOUS
  Filled 2015-04-07: qty 100

## 2015-04-07 NOTE — Progress Notes (Signed)
Palliative Care Daily Progress Note   S: Medical record reviewed, followed up with RN and Dr Hilma Favors regarding patient's condition.   B: Code Status: DNR  Advanced Directives: Does patient have an advance directive?: No  Family/Support: wife is at bedside  32 Plan of Care Goal is continue treatment for now; waiting for Dr Hilma Favors to round.   A: Patient's current condition: just finished breakfast. He is confused, especially upon waking, but is redirected easily. RN and patient report itching all night; pt got benadryl and Eucerin cream this am to legs.  Family is concerned about d/c plan Discharge Planning: per physicians. Wife is concerned about facility placement, as she is no longer able to care for the patient at home.  R: Support offered at this time.  Palliative Care Team will continue to follow patient.    04/07/2015 9:41 AM  Larina Earthly

## 2015-04-07 NOTE — Progress Notes (Signed)
Assessment: 1 Acute on CKD, improving 2 Severe morbid obesity 3 SOB - improved with volume removal 4 DM2 5 HTN 6 Volume - better 7 Severe hyperkalemia, resolved 8 Junctional bradycardia - due to #7  Plan - stop IV diuretic, PO prn  Subjective: Interval History: Breathing better  Objective: Vital signs in last 24 hours: Temp:  [98 F (36.7 C)-98.7 F (37.1 C)] 98 F (36.7 C) (02/16 0652) Pulse Rate:  [71-77] 71 (02/16 0652) Resp:  [18-20] 20 (02/16 0652) BP: (142-172)/(45-77) 147/77 mmHg (02/16 0652) SpO2:  [94 %-98 %] 97 % (02/16 0652) Weight change:   Intake/Output from previous day: 02/15 0701 - 02/16 0700 In: 1600 [P.O.:600; I.V.:1000] Out: 6550 [Urine:6550] Intake/Output this shift:   PE Abd protuberant Ext scaley and large  Lab Results: No results for input(s): WBC, HGB, HCT, PLT in the last 72 hours. BMET:  Recent Labs  04/05/15 1222 04/06/15 0815  NA 145 144  K 5.8* 5.3*  CL 108 107  CO2 25 25  GLUCOSE 133* 159*  BUN 65* 56*  CREATININE 4.76* 4.25*  CALCIUM 7.5* 7.5*   No results for input(s): PTH in the last 72 hours. Iron Studies: No results for input(s): IRON, TIBC, TRANSFERRIN, FERRITIN in the last 72 hours. Studies/Results: No results found.  Scheduled: . Chlorhexidine Gluconate Cloth  6 each Topical Q0600  . hydrocerin  1 application Topical Daily  . mupirocin ointment  1 application Nasal BID  . nystatin   Topical TID       LOS: 3 days   Anan Dapolito C 04/07/2015,9:50 AM

## 2015-04-07 NOTE — Progress Notes (Signed)
[facility] PROGRESS NOTE  Dale Henry [id number] DOB: Sep 09, 1945 DOA: 04/04/2015 PCP: PROVIDER NOT IN SYSTEM   Discussion from H and P: Dale Henry is an unfortunate 54M whose medical care is limited by his super-morbid obesity. He has known stage III CKD and now has superimposed acute renal failure complicated by marked hyperkalemia with bradycardia and widened QRS on EKG. Temporizing measures are being attempted, but his prognosis is exceedingly poor without dialysis and he is NOT a dialysis candidate (see Dr. Barkley Bruns note). Other medical issues include supratherapeutic INR and concern for occult bleed after recent fall with Hgb 8.2. His anemia may simply reflect volume overload in the setting of renal failure as his baseline in 09/2014 appeared to be around 9. He is hypotensive by cuff pressures, but it is not clear how accurate these are, as his mental status remains quite good.   Assessment/Plan: Active Problems:   Acute on chronic renal failure (HCC) with metabolic acidosis - Nephrology on board and managing - Palliative on board  Hypotension  - resolved - Palliative on board   Hyperkalemia - stable at 5.3  Acute hypoxemic respiratory status - Pt currently off supplemental oxygen   Code Status: DO NOT RESUSCITATE Family Communication: Discussed directly with patient Disposition Plan: Pending improvement in condition   Consultants:  Palliative care  Nephrology  Procedures:  None  Antibiotics:  None  HPI/Subjective: No new complaints reported  Objective: Filed Vitals:   04/07/15 0652 04/07/15 1053  BP: 147/77 147/44  Pulse: 71 74  Temp: 98 F (36.7 C) 98.8 F (37.1 C)  Resp: 20 18    Intake/Output Summary (Last 24 hours) at 04/07/15 1341 Last data filed at 04/07/15 1054  Gross per 24 hour  Intake   1040 ml  Output   6550 ml  Net  -5510 ml   Filed Weights   04/04/15 0831 04/05/15 0500  Weight: 193.232 kg (426 lb) 193.686 kg (427 lb)     Exam:   General:  Patient in no acute distress, alert and awake  Cardiovascular: no cyanosis, pink extremities  Respiratory: no increased wob, breathing comfortably on room air.  Abdomen: Soft, obese, nontender  Musculoskeletal: No clubbing   Data Reviewed: Basic Metabolic Panel:  Recent Labs Lab 04/04/15 1805 04/04/15 2334 04/05/15 1222 04/06/15 0815 04/07/15 1016  NA 141 143 145 144 140  K 6.6* 6.4* 5.8* 5.3* 5.3*  CL 109 110 108 107 104  CO2 18* 19* 25 25 25   GLUCOSE 174* 143* 133* 159* 244*  BUN 73* 72* 65* 56* 47*  CREATININE 5.56* 5.37* 4.76* 4.25* 3.53*  CALCIUM 7.7* 7.6* 7.5* 7.5* 8.0*  PHOS  --   --  6.4*  --   --    Liver Function Tests:  Recent Labs Lab 04/04/15 0231 04/05/15 1222  AST 13*  --   ALT 14*  --   ALKPHOS 80  --   BILITOT 0.2*  --   PROT 6.5  --   ALBUMIN 2.6* 2.1*   No results for input(s): LIPASE, AMYLASE in the last 168 hours. No results for input(s): AMMONIA in the last 168 hours. CBC:  Recent Labs Lab 04/04/15 0231 04/04/15 0513 04/04/15 0522  WBC 6.0  --  5.9  NEUTROABS 4.2  --  4.2  HGB 8.5* 9.2* 8.2*  HCT 28.1* 27.0* 27.2*  MCV 89.2  --  88.9  PLT 189  --  204   Cardiac Enzymes:  Recent Labs Lab 04/04/15 0521  CKTOTAL  186   BNP (last 3 results)  Recent Labs  04/04/15 0231  BNP 147.0*    ProBNP (last 3 results) No results for input(s): PROBNP in the last 8760 hours.  CBG:  Recent Labs Lab 04/04/15 1221 04/04/15 1550 04/04/15 1959 04/05/15 0001 04/06/15 2350  GLUCAP 130* 174* 208* 129* 205*    Recent Results (from the past 240 hour(s))  MRSA PCR Screening     Status: Abnormal   Collection Time: 04/04/15  8:51 AM  Result Value Ref Range Status   MRSA by PCR POSITIVE (A) NEGATIVE Final    Comment:        The GeneXpert MRSA Assay (FDA approved for NASAL specimens only), is one component of a comprehensive MRSA colonization surveillance program. It is not intended to diagnose  MRSA infection nor to guide or monitor treatment for MRSA infections. RESULT CALLED TO, READ BACK BY AND VERIFIED WITH: L. BASS RN 11:30 04/04/15 ([email or web address])   Culture, blood (routine x 2)     Status: None (Preliminary result)   Collection Time: 04/04/15  2:24 PM  Result Value Ref Range Status   Specimen Description BLOOD RIGHT HAND  Final   Special Requests BOTTLES DRAWN AEROBIC ONLY 3CC  Final   Culture NO GROWTH 3 DAYS  Final   Report Status PENDING  Incomplete  Culture, blood (routine x 2)     Status: None (Preliminary result)   Collection Time: 04/04/15  2:26 PM  Result Value Ref Range Status   Specimen Description BLOOD RIGHT HAND  Final   Special Requests BOTTLES DRAWN AEROBIC ONLY 3CC  Final   Culture NO GROWTH 3 DAYS  Final   Report Status PENDING  Incomplete     Studies: No results found.  Scheduled Meds: . Chlorhexidine Gluconate Cloth  6 each Topical Q0600  . [START ON 04/08/2015] fluconazole (DIFLUCAN) IV  100 mg Intravenous Q24H  . fluconazole (DIFLUCAN) IV  200 mg Intravenous Once  . furosemide  40 mg Oral Daily  . hydrocerin  1 application Topical Daily  . mupirocin ointment  1 application Nasal BID  . nystatin   Topical TID   Continuous Infusions: . sodium chloride 10 mL/hr at 04/06/15 1214    Time spent: > 35 minutes   Velvet Bathe  [facility] Pager [phone] If 7PM-7AM, please contact night-coverage at [email or web address], password [facility] 04/07/2015, 1:41 PM  LOS: 3 days

## 2015-04-07 NOTE — Progress Notes (Signed)
Daily Progress Note   Patient Name: Dale Henry       Date: 04/07/2015 DOB: 19-Jul-1945  Age: 70 y.o. MRN#: [id number] Attending Physician: Velvet Bathe, MD Primary Care Physician: PROVIDER NOT IN SYSTEM Admit Date: 04/04/2015  Reason for Consultation/Follow-up: Disposition, Establishing goals of care, Hospice Evaluation, Non pain symptom management, Pain control and Psychosocial/spiritual support  Subjective: Dale Henry is more agitated today and confused but still able to have purposeful conversation and is much calmer now that his wife is at the bedside.  -He believes his abdomen is much less painful and not as "heavy". -Severe itching in his groin and on his bilateral legs- severe venous stasis  Prior to this admission he was able to ambulate at home with a walker.  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  Wife tells me that he has a very poor QOL and that he has been through multiple hospitalizations at [facility] and also been to rehab at least twice. Overall he is declining and never gets back to where he was before with each hospitalization. IF HE IS UNABLE TO WALK OR USE HIS COMPUTER OR BE COMPLETELY BEDBOUND- he does not want his life prolonged and would want full comfort care.  Length of Stay: 3 days  Current Medications: Scheduled Meds:  . Chlorhexidine Gluconate Cloth  6 each Topical Q0600  . hydrocerin  1 application Topical Daily  . mupirocin ointment  1 application Nasal BID  . nystatin   Topical TID    Continuous Infusions: . sodium chloride 10 mL/hr at 04/06/15 1214    PRN Meds: diphenhydrAMINE, fentaNYL (SUBLIMAZE) injection, haloperidol lactate, senna  Physical Exam: Physical Exam    Constitutional:  Morbidly obese, severe venous stasis  HENT:  Head: Normocephalic and atraumatic.  Eyes: Pupils are equal, round, and reactive to light.  Neck: Normal range of motion.  Cardiovascular: Normal rate.   Pulmonary/Chest: Effort normal.  Abdominal:  Severe superficial skin changes from obesity/pannus, fungal infection evident- much less edematous and less red and inflammed- still has intertrigonous infection.                Vital Signs: BP 147/77 mmHg  Pulse 71  Temp(Src) 98 F (36.7 C) (Oral)  Resp 20  Ht 5'  5" (1.651 m)  Wt 193.686 kg (427 lb)  BMI 71.06 kg/m2  SpO2 97% SpO2: SpO2: 97 % O2 Device: O2 Device: Not Delivered O2 Flow Rate: O2 Flow Rate (L/min): 2 L/min  Intake/output summary:  Intake/Output Summary (Last 24 hours) at 04/07/15 1012 Last data filed at 04/07/15 0600  Gross per 24 hour  Intake   1160 ml  Output   6050 ml  Net  -4890 ml   LBM: Last BM Date: 04/06/15 Baseline Weight: Weight: (!) 193.232 kg (426 lb) Most recent weight: Weight: (!) 193.686 kg (427 lb)       Palliative Assessment/Data: Flowsheet Rows        Most Recent Value   Intake Tab    Referral Department  Nephrology   Unit at Time of Referral  ICU   Palliative Care Primary Diagnosis  Nephrology   Date Notified  04/04/15   Palliative Care Type  New Palliative care   Reason for referral  Clarify Goals of Care   Date of Admission  04/04/15   Date first seen by Palliative Care  04/04/15   # of days Palliative referral response time  0 Day(s)   # of days IP prior to Palliative referral  0   Clinical Assessment    Palliative Performance Scale Score  30%   Pain Max last 24 hours  10   Pain Min Last 24 hours  4   Dyspnea Max Last 24 Hours  2   Dyspnea Min Last 24 hours  0   Nausea Max Last 24 Hours  7   Nausea Min Last 24 Hours  7   Anxiety Max Last 24 Hours  2   Anxiety Min Last 24 Hours  2   Other Max Last 24 Hours  Not able to report   Psychosocial & Spiritual  Assessment    Palliative Care Outcomes    Patient/Family meeting held?  Yes   Palliative Care Outcomes  Clarified goals of care, Counseled regarding hospice, Changed to focus on comfort      Additional Data Reviewed: CBC    Component Value Date/Time   WBC 5.9 04/04/2015 0522   RBC 3.06* 04/04/2015 0522   HGB 8.2* 04/04/2015 0522   HCT 27.2* 04/04/2015 0522   PLT 204 04/04/2015 0522   MCV 88.9 04/04/2015 0522   MCH 26.8 04/04/2015 0522   MCHC 30.1 04/04/2015 0522   RDW 16.7* 04/04/2015 0522   LYMPHSABS 1.2 04/04/2015 0522   MONOABS 0.3 04/04/2015 0522   EOSABS 0.2 04/04/2015 0522   BASOSABS 0.0 04/04/2015 0522    CMP     Component Value Date/Time   NA 144 04/06/2015 0815   K 5.3* 04/06/2015 0815   CL 107 04/06/2015 0815   CO2 25 04/06/2015 0815   GLUCOSE 159* 04/06/2015 0815   BUN 56* 04/06/2015 0815   CREATININE 4.25* 04/06/2015 0815   CALCIUM 7.5* 04/06/2015 0815   PROT 6.5 04/04/2015 0231   ALBUMIN 2.1* 04/05/2015 1222   AST 13* 04/04/2015 0231   ALT 14* 04/04/2015 0231   ALKPHOS 80 04/04/2015 0231   BILITOT 0.2* 04/04/2015 0231   GFRNONAA 13* 04/06/2015 0815   GFRAA 15* 04/06/2015 0815       Problem List:  Patient Active Problem List   Diagnosis Date Noted  . Fall   . Acute on chronic renal failure ([place]) 04/04/2015  . Cellulitis and abscess of trunk 04/04/2015  . Renal insufficiency 10/14/2014  . Acute renal failure ([place]) 10/14/2014  .  Hyperkalemia   . Knee pain, acute      Palliative Care Assessment & Plan   FOR TODAY- WILL ASK PT/OT TO EVAL AND DETERMINE FUNCTIONAL STATUS AND POTENTIAL FOR REHAB- IF UNABLE TO REHAB OR MOBILIZE DUE TO HIS CONDITION AND THIS IS AN UNREALISTIC GOAL THEN THEY WOULD WANT TO TRANSITION TO FULL COMFORT CARE.  Dale Henry overall prognosis is VERY poor- he has irreversible kidney disease which will ultimately take his life. His wife reports that he is not happy and has a miserable existence- she is devoted and has been  caring for him at home between hospitalizations and rehab stays for several years. She is accepting that he may be at EOL- Dale Henry is confused but is able to endorse most of what his wife is saying. Conversations were held at bedside,  If rehab is not a reasonable or realistic option - and they desire to transition to comfort care, I would recommend hospice facility for his care -at that point I would discontinue any life prolonging interventions and administer medications for comfort only.His prognosis would be <2 weeks at that point. At best his prognosis now is <6 months.  Patient has not gotten pain PRNs but has had significant neuropathic pain in his hands. He has also not gotten agitation PRNs and has had issues with being restless and having anxiety.  MAJOR ISSUES:  1. Attention to his Pain, Agitation and Itching  Encourage administration of PRNs  Scheduled PM Haldol  Itching is from fungal infection-start Sarna and will treat with oral fluconazole  Transition IV antibiotics to oral- he has definite improvement in the redness of his pannus  Diurese for comfort - wife reports following lasix dose his abdomen and legs decreased in size-suspect there  Diastolic failure and peripheral edema-in addition to OSA/OHS  2. Disposition in terms of Goals of Care Pending PT consult  3. Foley catheter Need to consider when to discontinue, he has a history of invasive bladder cancer followed by Dr. Felipa Eth in [facility].   1.Code Status:  DNR    Code Status Orders        Start     Ordered   04/05/15 [id number]  Do not attempt resuscitation (DNR)   Continuous    Question Answer Comment  In the event of cardiac or respiratory ARREST Do not call a "code blue"   In the event of cardiac or respiratory ARREST Do not perform Intubation, CPR, defibrillation or ACLS   In the event of cardiac or respiratory ARREST Use medication by any route, position, wound care, and other measures to relive  pain and suffering. May use oxygen, suction and manual treatment of airway obstruction as needed for comfort.      04/05/15 1608    Code Status History    Date Active Date Inactive Code Status Order ID Comments User Context   04/04/2015  8:44 AM 04/05/2015  4:08 PM DNR [id number]  Raylene Miyamoto, MD Inpatient   04/04/2015  6:25 AM 04/04/2015  8:44 AM Full Code [id number]  Dannielle Burn, MD ED   10/14/2014  8:44 AM 10/18/2014  9:14 PM Full Code [id number]  Theodis Blaze, MD Inpatient       2. Goals of Care/Additional Recommendations:  MAY BE MOVING TOWARDS COMFORT CARE  Limitations on Scope of Treatment: Minimize Medications  Desire for further Chaplaincy support:no  Psycho-social Needs: Caregiving  Support/Resources and Education on Hospice  3. Symptom Management: 1.Pain PRN 2. Scheduled tylenol  and haldol QHS 3. Fluconazole and topical nystatin  Recommendations as above.    REMOVE FLEXISEAL 4. Palliative Prophylaxis:   Aspiration, Delirium Protocol, Frequent Pain Assessment and Turn Reposition  5. Prognosis: Unable to determine  6. Discharge Planning:  TBD   Care plan was discussed with wife.  Thank you for allowing the Palliative Medicine Team to assist in the care of this patient.   Time In: 10 Time Out: 1035 Total Time 35 min Prolonged Time Billed no        Acquanetta Chain, DO  04/07/2015, 10:12 AM  Please contact Palliative Medicine Team phone at [phone] for questions and concerns.

## 2015-04-07 NOTE — Progress Notes (Signed)
ANTIBIOTIC CONSULT NOTE - INITIAL  Pharmacy Consult for Fluconazole Indication: topical fungal infection  Allergies  Allergen Reactions  . Codeine Itching  . Shellfish Allergy Nausea Only    Patient Measurements: Height: 5\' 5"  (165.1 cm) Weight: (!) 427 lb (193.686 kg) IBW/kg (Calculated) : 61.5  Labs:  Recent Labs  04/05/15 1222 04/06/15 0815 04/07/15 1016  CREATININE 4.76* 4.25* 3.53*   Estimated Creatinine Clearance: 31.1 mL/min (by C-G formula based on Cr of 3.53).  Microbiology: Recent Results (from the past 720 hour(s))  MRSA PCR Screening     Status: Abnormal   Collection Time: 04/04/15  8:51 AM  Result Value Ref Range Status   MRSA by PCR POSITIVE (A) NEGATIVE Final    Comment:        The GeneXpert MRSA Assay (FDA approved for NASAL specimens only), is one component of a comprehensive MRSA colonization surveillance program. It is not intended to diagnose MRSA infection nor to guide or monitor treatment for MRSA infections. RESULT CALLED TO, READ BACK BY AND VERIFIED WITH: L. BASS RN 11:30 04/04/15 ([email or web address])   Culture, blood (routine x 2)     Status: None (Preliminary result)   Collection Time: 04/04/15  2:24 PM  Result Value Ref Range Status   Specimen Description BLOOD RIGHT HAND  Final   Special Requests BOTTLES DRAWN AEROBIC ONLY 3CC  Final   Culture NO GROWTH 3 DAYS  Final   Report Status PENDING  Incomplete  Culture, blood (routine x 2)     Status: None (Preliminary result)   Collection Time: 04/04/15  2:26 PM  Result Value Ref Range Status   Specimen Description BLOOD RIGHT HAND  Final   Special Requests BOTTLES DRAWN AEROBIC ONLY 3CC  Final   Culture NO GROWTH 3 DAYS  Final   Report Status PENDING  Incomplete    Medical History: Past Medical History  Diagnosis Date  . Morbid obesity ([place])   . Diabetes mellitus without complication ([place])    Assessment:   To begin Fluconazole IV per discussion with Dr. Hilma Favors this morning for fungal  infection of abdomen, groin, and pannus.   Acute on CKD, obesity.  Goal of Therapy:  appropriate Fluconazole dose for renal function and infection  Plan:   Fluconazole 200 mg IV x 1 today, then 100 mg IV daily.  Will follow for transition to oral when able.  Arty Baumgartner, [place] Pager: [phone] 04/07/2015,12:40 PM

## 2015-04-07 NOTE — Care Management Important Message (Signed)
Important Message  Patient Details  Name: KAUSHIK FEENEY MRN: [id number] Date of Birth: 12-02-1945   Medicare Important Message Given:  Yes    Candace Begue, Rory Percy, RN 04/07/2015, 12:06 PM

## 2015-04-08 DIAGNOSIS — B369 Superficial mycosis, unspecified: Secondary | ICD-10-CM

## 2015-04-08 DIAGNOSIS — R339 Retention of urine, unspecified: Secondary | ICD-10-CM

## 2015-04-08 DIAGNOSIS — I8311 Varicose veins of right lower extremity with inflammation: Secondary | ICD-10-CM

## 2015-04-08 DIAGNOSIS — N179 Acute kidney failure, unspecified: Principal | ICD-10-CM

## 2015-04-08 DIAGNOSIS — E875 Hyperkalemia: Secondary | ICD-10-CM

## 2015-04-08 DIAGNOSIS — I8312 Varicose veins of left lower extremity with inflammation: Secondary | ICD-10-CM

## 2015-04-08 DIAGNOSIS — N189 Chronic kidney disease, unspecified: Secondary | ICD-10-CM

## 2015-04-08 LAB — BASIC METABOLIC PANEL
ANION GAP: 10 (ref 5–15)
BUN: 41 mg/dL — ABNORMAL HIGH (ref 6–20)
CALCIUM: 8.5 mg/dL — AB (ref 8.9–10.3)
CHLORIDE: 106 mmol/L (ref 101–111)
CO2: 24 mmol/L (ref 22–32)
Creatinine, Ser: 2.94 mg/dL — ABNORMAL HIGH (ref 0.61–1.24)
GFR calc non Af Amer: 20 mL/min — ABNORMAL LOW (ref >60)
GFR, EST AFRICAN AMERICAN: 23 mL/min — AB (ref >60)
GLUCOSE: 264 mg/dL — AB (ref 65–99)
Potassium: 5.2 mmol/L — ABNORMAL HIGH (ref 3.5–5.1)
Sodium: 140 mmol/L (ref 135–145)

## 2015-04-08 LAB — GLUCOSE, CAPILLARY: Glucose-Capillary: 295 mg/dL — ABNORMAL HIGH (ref 65–99)

## 2015-04-08 MED ORDER — ONDANSETRON HCL 4 MG/2ML IJ SOLN
4.0000 mg | Freq: Four times a day (QID) | INTRAMUSCULAR | Status: DC | PRN
  Administered 2015-04-08: 4 mg via INTRAVENOUS
  Filled 2015-04-08: qty 2

## 2015-04-08 MED ORDER — LORAZEPAM 0.5 MG PO TABS
0.5000 mg | ORAL_TABLET | Freq: Once | ORAL | Status: AC
  Administered 2015-04-08: 0.5 mg via ORAL
  Filled 2015-04-08: qty 1

## 2015-04-08 NOTE — Progress Notes (Signed)
Physical Therapy Evaluation  Full report to follow. Patient was able to sit on EOB with 1 person assist (using bed rail) and sat x 10 minutes. Unable to reach the floor with both feet to attempt standing. (Pt was ambulatory PTA. States recent fall was unusual for him). Patient wants to ultimately return home and wants to work with PT to regain mobility/strength.   Currently PT recommending SNF for therapies. Patient is not sure he will agree to that after a bad experience at [facility]. Wife not present to discuss. Patient wants to talk to her about home (by ambulance) at bed level with HHPT vs recommended SNF.   04/08/2015 Barry Brunner, PT Pager: [phone]

## 2015-04-08 NOTE — Progress Notes (Signed)
Assessment: 1 Acute on CKD( cr 2.15 in 8/16), improved ? New baseline renal fct 2 Severe morbid obesity 3 SOB/volume overload - improved 4 Volume - better 5 Severe hyperkalemia, resolved 6 Junctional bradycardia - due to #5  Plan - stop diuretic, please resume PRN, we will sign off  Subjective: Interval History: {Breathing better  Objective: Vital signs in last 24 hours: Temp:  [98.2 F (36.8 C)-98.8 F (37.1 C)] 98.4 F (36.9 C) (02/17 0801) Pulse Rate:  [69-74] 71 (02/17 0801) Resp:  [18-20] 18 (02/17 0801) BP: (147-176)/(44-61) 176/59 mmHg (02/17 0801) SpO2:  [92 %-94 %] 94 % (02/17 0801) Weight change:   Intake/Output from previous day: 02/16 0701 - 02/17 0700 In: 600 [P.O.:600] Out: 5700 [Urine:5700] Intake/Output this shift:    General appearance: alert and cooperative Extremities: edema less, scaly LEs bilat  Lab Results: No results for input(s): WBC, HGB, HCT, PLT in the last 72 hours. BMET:  Recent Labs  04/06/15 0815 04/07/15 1016  NA 144 140  K 5.3* 5.3*  CL 107 104  CO2 25 25  GLUCOSE 159* 244*  BUN 56* 47*  CREATININE 4.25* 3.53*  CALCIUM 7.5* 8.0*   No results for input(s): PTH in the last 72 hours. Iron Studies: No results for input(s): IRON, TIBC, TRANSFERRIN, FERRITIN in the last 72 hours. Studies/Results: No results found.  Scheduled: . Chlorhexidine Gluconate Cloth  6 each Topical Q0600  . fluconazole (DIFLUCAN) IV  100 mg Intravenous Q24H  . hydrocerin  1 application Topical Daily  . mupirocin ointment  1 application Nasal BID  . nystatin   Topical TID     LOS: 4 days   Chey Cho C 04/08/2015,10:22 AM

## 2015-04-08 NOTE — Progress Notes (Signed)
Palliative Medicine RN Note. No family present. Pt denies pain at this time. Waiting on therapy recs to determine d/c plan.  Larina Earthly, RN, BSN, [facility] 04/08/2015 10:04 AM Cell [phone] 8:00-4:00 Monday-Friday Office [phone]

## 2015-04-08 NOTE — Evaluation (Signed)
Physical Therapy Evaluation (see also recent brief note) Patient Details Name: Dale Henry MRN: [id number] DOB: 18-May-1945 Today's Date: 04/08/2015   History of Present Illness  Adm 2/13 for difficulty breathing. Pt fell at home several days prior and breathing worsened after this fall. Pt also with acute on chronic renal failure (not an HD candidate), supratherapeutic INR, cellulitis of pannus, and bradycardia. Patient has been treated medically and Palliative Consult was made. Renal function has improved. PMHx- morbid obesity, DM, renal failure,     Clinical Impression  Pt admitted with above diagnosis. Pt currently with functional limitations due to the deficits listed below (see PT Problem List).  Pt will benefit from skilled PT to increase their independence and safety with mobility to allow discharge to the venue listed below.    NOTE: Patient was able to sit on EOB with 1 person assist (using bed rail) and sat x 10 minutes. Unable to reach the floor with both feet to attempt standing. (Pt was ambulatory PTA. States recent fall was unusual for him). Patient wants to ultimately return home and wants to work with PT to regain mobility/strength.       Follow Up Recommendations SNF;Supervision/Assistance - 24 hour  (Pt wants to discuss plan with wife as he does not want to return to SNF.   If patient/wife decide to go home, he will need ambulance transport, HHPT, [place], and maximal aide/nursing assist as possible. No further DME indicated.     Equipment Recommendations  None recommended by PT    Recommendations for Other Services       Precautions / Restrictions Precautions Precautions: Fall      Mobility  Bed Mobility Overal bed mobility: Needs Assistance Bed Mobility: Supine to Sit;Sit to Supine     Supine to sit: Min assist;HOB elevated (with rail and incr time/effort (air mattress deflated)) Sit to supine: Min assist   General bed mobility comments: required +3 to  scoot up to [facility] once supine  Transfers                 General transfer comment: not tested  Ambulation/Gait                Stairs            Wheelchair Mobility    Modified Rankin (Stroke Patients Only)       Balance Overall balance assessment: Needs assistance Sitting-balance support: Bilateral upper extremity supported;Feet unsupported Sitting balance-Leahy Scale: Poor Sitting balance - Comments: due to edge of air mattress causing posterior lean; pt sat with bil UE support (no external support) x 10 minutes                                     Pertinent Vitals/Pain Pain Assessment: No/denies pain    Home Living Family/patient expects to be discharged to:: Unsure Living Arrangements: Spouse/significant other Available Help at Discharge: Available 24 hours/day Type of Home: House Home Access: Stairs to enter Entrance Stairs-Rails: Right Entrance Stairs-Number of Steps: 3 Home Layout: One level Home Equipment: Bedside commode;Electric scooter;Hospital bed;Walker - 2 wheels Additional Comments: reports hospital bed does not raise from floor enough to assist him with transfers; wife reports BSC not wide enough for him    Prior Function Level of Independence: Needs assistance   Gait / Transfers Assistance Needed: typically short distance (~20 ft per pt) ambulator with RW, has scooter for mobility  inside home; reports prior to recent fall he hasn't fallen in almost a year  ADL's / Homemaking Assistance Needed: requires assist for bathing and dressing from spouse        Hand Dominance   Dominant Hand: Right    Extremity/Trunk Assessment   Upper Extremity Assessment: RUE deficits/detail RUE Deficits / Details: very limited shoulder ROM (torn rotator cuff per pt); elbow and hand WFL         Lower Extremity Assessment: Generalized weakness (AROM WFL; at least 3/5 strength)      Cervical / Trunk Assessment: Other exceptions   Communication   Communication: HOH  Cognition Arousal/Alertness: Awake/alert Behavior During Therapy: WFL for tasks assessed/performed Overall Cognitive Status: Within Functional Limits for tasks assessed                      General Comments      Exercises        Assessment/Plan    PT Assessment Patient needs continued PT services  PT Diagnosis Generalized weakness;Difficulty walking   PT Problem List Decreased strength;Decreased activity tolerance;Decreased balance;Decreased mobility;Decreased knowledge of use of DME;Obesity  PT Treatment Interventions DME instruction;Functional mobility training;Therapeutic activities;Therapeutic exercise;Patient/family education   PT Goals (Current goals can be found in the Care Plan section) Acute Rehab PT Goals Patient Stated Goal: to return home and walk agai PT Goal Formulation: With patient Time For Goal Achievement: 04/22/15 Potential to Achieve Goals: Fair    Frequency Min 2X/week   Barriers to discharge Inaccessible home environment;Decreased caregiver support steps to enter    Co-evaluation               End of Session   Activity Tolerance: Patient tolerated treatment well Patient left: in bed;with nursing/sitter in room Nurse Communication: Mobility status         Time: [id number] PT Time Calculation (min) (ACUTE ONLY): 36 min   Charges:   PT Evaluation $PT Eval High Complexity: 1 Procedure PT Treatments $Therapeutic Activity: 8-22 mins   PT G Codes:        Carlis Burnsworth 05/01/15, 7:21 PM Pager [phone]

## 2015-04-08 NOTE — Progress Notes (Signed)
[facility] PROGRESS NOTE  Dale Henry [id number] DOB: December 23, 1945 DOA: 04/04/2015 PCP: PROVIDER NOT IN SYSTEM   Discussion from H and P: Dale Henry is an unfortunate 70M whose medical care is limited by his super-morbid obesity. He has known stage III CKD and now has superimposed acute renal failure complicated by marked hyperkalemia with bradycardia and widened QRS on EKG. Temporizing measures are being attempted, but his prognosis is exceedingly poor without dialysis and he is NOT a dialysis candidate (see Dr. Barkley Bruns note). Other medical issues include supratherapeutic INR and concern for occult bleed after recent fall with Hgb 8.2. His anemia may simply reflect volume overload in the setting of renal failure as his baseline in 09/2014 appeared to be around 9. He is hypotensive by cuff pressures, but it is not clear how accurate these are, as his mental status remains quite good.   Assessment/Plan: Active Problems:   Acute on chronic renal failure (HCC) with metabolic acidosis and hyperkalemia  -creatinine now back to baseline (2.9 - Nephrology on board and managing; appreciate assistance and recommendations - Palliative on board -Waiting the evaluation from physical therapy to determine what will be the next step for discharge and advanced directives   Hypotension  - resolved - Currently holding any antihypertensive agents  Hyperkalemia - stable at 5.2  Acute hypoxemic respiratory status - Pt currently off supplemental oxygen and breathing better -Good urine output -Will follow up on status and need/record say for oxygen supplementation  Pannus candidiasis  -Will continue the use of fluconazole and topical nystatin  Urinary retention  -Patient with Foley catheter in place  -Once physical therapy evaluation completed will attempt voiding trials   Code Status: DO NOT RESUSCITATE Family Communication: Discussed directly with patient; no family at  bedside Disposition Plan: Currently awaiting evaluation and physical therapy for a realistic assessment of potential rehabilitation; indicates that the patient is found not to be a candidate for rehabilitation plan is to switch gears towards symptomatic management and comfort care.   Consultants:  Palliative care  Nephrology  Procedures:  None  Antibiotics:  None  HPI/Subjective: No fever, reports improvement in his shortness of breath and denies chest pain. Good urine output overnight.  Objective: Filed Vitals:   04/07/15 2205 04/08/15 0801  BP: 158/61 176/59  Pulse: 69 71  Temp: 98.2 F (36.8 C) 98.4 F (36.9 C)  Resp: 20 18    Intake/Output Summary (Last 24 hours) at 04/08/15 1534 Last data filed at 04/08/15 1400  Gross per 24 hour  Intake    720 ml  Output   3900 ml  Net  -3180 ml   Filed Weights   04/04/15 0831 04/05/15 0500  Weight: 193.232 kg (426 lb) 193.686 kg (427 lb)    Exam:   General:  Patient in no acute distress, alert and awake, oriented 2. Afebrile and reported improving in his breathing.  Cardiovascular: Unable to properly assess JVD due to body habitus; S1 and S2 appreciated, no rubs or gallops  Respiratory: no increased work of breathing; scattered rhonchi, no wheezing  Abdomen: Soft, obese, nontender  Musculoskeletal: 2+ edema bilaterally with evidence assistant dermatitis changes appreciated. No cyanosis  Data Reviewed: Basic Metabolic Panel:  Recent Labs Lab 04/04/15 2334 04/05/15 1222 04/06/15 0815 04/07/15 1016 04/08/15 1048  NA 143 145 144 140 140  K 6.4* 5.8* 5.3* 5.3* 5.2*  CL 110 108 107 104 106  CO2 19* 25 25 25 24   GLUCOSE 143* 133* 159* 244* 264*  BUN 72* 65* 56* 47* 41*  CREATININE 5.37* 4.76* 4.25* 3.53* 2.94*  CALCIUM 7.6* 7.5* 7.5* 8.0* 8.5*  PHOS  --  6.4*  --   --   --    Liver Function Tests:  Recent Labs Lab 04/04/15 0231 04/05/15 1222  AST 13*  --   ALT 14*  --   ALKPHOS 80  --   BILITOT  0.2*  --   PROT 6.5  --   ALBUMIN 2.6* 2.1*   CBC:  Recent Labs Lab 04/04/15 0231 04/04/15 0513 04/04/15 0522  WBC 6.0  --  5.9  NEUTROABS 4.2  --  4.2  HGB 8.5* 9.2* 8.2*  HCT 28.1* 27.0* 27.2*  MCV 89.2  --  88.9  PLT 189  --  204   Cardiac Enzymes:  Recent Labs Lab 04/04/15 0521  CKTOTAL 186   BNP (last 3 results)  Recent Labs  04/04/15 0231  BNP 147.0*   CBG:  Recent Labs Lab 04/04/15 1550 04/04/15 1959 04/05/15 0001 04/06/15 2350 04/07/15 2203  GLUCAP 174* 208* 129* 205* 232*    Recent Results (from the past 240 hour(s))  MRSA PCR Screening     Status: Abnormal   Collection Time: 04/04/15  8:51 AM  Result Value Ref Range Status   MRSA by PCR POSITIVE (A) NEGATIVE Final    Comment:        The GeneXpert MRSA Assay (FDA approved for NASAL specimens only), is one component of a comprehensive MRSA colonization surveillance program. It is not intended to diagnose MRSA infection nor to guide or monitor treatment for MRSA infections. RESULT CALLED TO, READ BACK BY AND VERIFIED WITH: L. BASS RN 11:30 04/04/15 ([email or web address])   Culture, blood (routine x 2)     Status: None (Preliminary result)   Collection Time: 04/04/15  2:24 PM  Result Value Ref Range Status   Specimen Description BLOOD RIGHT HAND  Final   Special Requests BOTTLES DRAWN AEROBIC ONLY 3CC  Final   Culture NO GROWTH 3 DAYS  Final   Report Status PENDING  Incomplete  Culture, blood (routine x 2)     Status: None (Preliminary result)   Collection Time: 04/04/15  2:26 PM  Result Value Ref Range Status   Specimen Description BLOOD RIGHT HAND  Final   Special Requests BOTTLES DRAWN AEROBIC ONLY 3CC  Final   Culture NO GROWTH 3 DAYS  Final   Report Status PENDING  Incomplete     Studies: No results found.  Scheduled Meds: . fluconazole (DIFLUCAN) IV  100 mg Intravenous Q24H  . hydrocerin  1 application Topical Daily  . mupirocin ointment  1 application Nasal BID  . nystatin   Topical  TID   Continuous Infusions: . sodium chloride 10 mL/hr at 04/06/15 1214    Time spent: > 30 minutes   Barton Dubois  [facility] Pager [phone] If 7PM-7AM, please contact night-coverage at [email or web address], password [facility] 04/08/2015, 3:34 PM  LOS: 4 days

## 2015-04-09 LAB — GLUCOSE, CAPILLARY: Glucose-Capillary: 276 mg/dL — ABNORMAL HIGH (ref 65–99)

## 2015-04-09 LAB — CULTURE, BLOOD (ROUTINE X 2)
CULTURE: NO GROWTH
Culture: NO GROWTH

## 2015-04-09 MED ORDER — NYSTATIN 100000 UNIT/GM EX POWD: CUTANEOUS | Status: AC

## 2015-04-09 MED ORDER — OXYCODONE HCL ER 15 MG PO T12A: 15.0000 mg | EXTENDED_RELEASE_TABLET | Freq: Two times a day (BID) | ORAL | Status: DC

## 2015-04-09 MED ORDER — OXYCODONE HCL ER 15 MG PO T12A
15.0000 mg | EXTENDED_RELEASE_TABLET | Freq: Two times a day (BID) | ORAL | Status: DC
  Administered 2015-04-09 – 2015-04-11 (×4): 15 mg via ORAL
  Filled 2015-04-09 (×4): qty 1

## 2015-04-09 MED ORDER — SENNA 8.6 MG PO TABS: 1.0000 | ORAL_TABLET | Freq: Every evening | ORAL | Status: DC | PRN

## 2015-04-09 MED ORDER — FLUCONAZOLE 100 MG PO TABS: 100.0000 mg | ORAL_TABLET | Freq: Every day | ORAL | Status: DC

## 2015-04-09 MED ORDER — HYDROCODONE-ACETAMINOPHEN 10-325 MG PO TABS: 1.0000 | ORAL_TABLET | ORAL | Status: DC | PRN

## 2015-04-09 NOTE — Progress Notes (Signed)
Late entry:  Earlier in shift, patient's wife spoke with RN and stated that the patient had not slept well for days.  She requested that he be given something to help with sleep.  Notified PA on-call regarding situation.  One-time dose of ativan 0.5 mg PO ordered and administered.  Will continue to monitor.

## 2015-04-09 NOTE — Care Management (Addendum)
CM reviewed patient's record for progression and transitional care planning. Plan for SNF placement, CSW consult placed.  CM remains available for consult and support in transitional care planning.  Laurena Slimmer RN BSN NCM [phone]

## 2015-04-09 NOTE — Discharge Summary (Addendum)
Physician Discharge Summary  SHABAN APARICIO [id number] DOB: 1945-04-03 DOA: 04/04/2015  PCP: PROVIDER NOT IN SYSTEM  Admit date: 04/04/2015 Discharge date: 04/09/2015  Time spent: > 35 minutes  Recommendations for Outpatient Follow-up:  1. Reassess potassium levels 2. Patient and family interested in palliative and possibly hospice services moving forward as a result home medication regimen has been simplified when compared to prior to admission medical regimen   Discharge Diagnoses:  Active Problems:   Acute on chronic renal failure (HCC)   Cellulitis and abscess of trunk   Fall   Acute delirium   Bladder cancer ([place])   Venous stasis dermatitis   Discharge Condition: Stable  Diet recommendation: Diabetic diet  Filed Weights   04/04/15 0831 04/05/15 0500  Weight: 193.232 kg (426 lb) 193.686 kg (427 lb)    History of present illness:  From original history of present illness: 70yo M who called EMS for complaints of shortness of breath. He reportedly fell a few days ago and had called EMS at that time, but refused transport when they arrived. His dyspnea symptoms have been getting worse since the fall. In the ED, labs revealed acute on chronic renal failure with creatinine 5.39 from 2.15 in 09/2014. He has a non-gap metabolic acidosis with HCO3 12. K is markedly elevated at 7.5 and EKG showed sinus bradycardia with widened QRS (170ms). His INR is supratherapeutic. He is awake, alert and conversant  Hospital Course:  Active Problems:  Acute on chronic renal failure (HCC) with metabolic acidosis - Serum creatinine trending down. Prognosis poor as such palliative consulted while patient was in house. Plan is for more of a comfort care approach. How ever patient wishes for physical therapy to try and regain some of his strength back  Hypotension  - resolved  Hyperkalemia - stable at 5.2  Acute hypoxemic respiratory status - Pt currently off supplemental  oxygen  Procedures:  None  Consultations:  Palliative  Renal  Discharge Exam: Filed Vitals:   04/09/15 0629 04/09/15 0800  BP: 164/64 148/46  Pulse: 72 73  Temp: 98.1 F (36.7 C) 98.2 F (36.8 C)  Resp: 17 18    General: Pt in nad, alert and awake Cardiovascular: rrr, no mrg Respiratory: cta bl, no wheezes  Discharge Instructions   Discharge Instructions    Call MD for:  severe uncontrolled pain    Complete by:  As directed      Call MD for:  temperature >100.4    Complete by:  As directed      Diet - low sodium heart healthy    Complete by:  As directed      Increase activity slowly    Complete by:  As directed           Current Discharge Medication List    START taking these medications   Details  fluconazole (DIFLUCAN) 100 MG tablet Take 1 tablet (100 mg total) by mouth daily. Qty: 6 tablet, Refills: 0    HYDROcodone-acetaminophen (NORCO) 10-325 MG tablet Take 1 tablet by mouth every 4 (four) hours as needed for moderate pain. Qty: 30 tablet, Refills: 0    nystatin (MYCOSTATIN/NYSTOP) 100000 UNIT/GM POWD As directed per package insert 3 times daily to affected area Refills: 0    oxyCODONE (OXYCONTIN) 15 mg 12 hr tablet Take 1 tablet (15 mg total) by mouth every 12 (twelve) hours. Qty: 60 tablet, Refills: 0    senna (SENOKOT) 8.6 MG TABS tablet Take 1 tablet (8.6 mg total)  by mouth at bedtime as needed for mild constipation. Qty: 120 each, Refills: 0      CONTINUE these medications which have NOT CHANGED   Details  atorvastatin (LIPITOR) 20 MG tablet Take 20 mg by mouth at bedtime.    docusate sodium (COLACE) 100 MG capsule Take 100 mg by mouth 2 (two) times daily.    ferrous sulfate 325 (65 FE) MG tablet Take 325 mg by mouth daily with breakfast.    gabapentin (NEURONTIN) 100 MG capsule Take 100 mg by mouth 2 (two) times daily.    hydrOXYzine (ATARAX/VISTARIL) 25 MG tablet Take 25 mg by mouth 2 (two) times daily.    insulin regular (NOVOLIN  R,HUMULIN R) 100 units/mL injection Inject 0-10 Units into the skin 3 (three) times daily before meals. Per sliding scale    Probiotic CAPS Take 1 capsule by mouth daily.    tamsulosin (FLOMAX) 0.4 MG CAPS capsule Take 0.4 mg by mouth daily.      STOP taking these medications     allopurinol (ZYLOPRIM) 300 MG tablet      amLODipine (NORVASC) 10 MG tablet      cloNIDine (CATAPRES) 0.2 MG tablet      diphenhydrAMINE (BENADRYL) 25 MG tablet      insulin NPH Human (HUMULIN N,NOVOLIN N) 100 UNIT/ML injection      levocetirizine (XYZAL) 5 MG tablet      lisinopril (PRINIVIL,ZESTRIL) 40 MG tablet      omeprazole (PRILOSEC) 20 MG capsule      pramipexole (MIRAPEX) 1 MG tablet      tiZANidine (ZANAFLEX) 2 MG tablet      traMADol (ULTRAM) 50 MG tablet      warfarin (COUMADIN) 10 MG tablet      insulin aspart (NOVOLOG) 100 UNIT/ML injection      insulin NPH Human (HUMULIN N,NOVOLIN N) 100 UNIT/ML injection        Allergies  Allergen Reactions  . Codeine Itching  . Shellfish Allergy Nausea Only      The results of significant diagnostics from this hospitalization (including imaging, microbiology, ancillary and laboratory) are listed below for reference.    Significant Diagnostic Studies: Ct Head Wo Contrast  04/04/2015  CLINICAL DATA:  Fall 36 hours ago. New shortness of breath after the fall. EXAM: CT HEAD WITHOUT CONTRAST CT CERVICAL SPINE WITHOUT CONTRAST TECHNIQUE: Multidetector CT imaging of the head and cervical spine was performed following the standard protocol without intravenous contrast. Multiplanar CT image reconstructions of the cervical spine were also generated. COMPARISON:  None. FINDINGS: CT HEAD FINDINGS Diffuse cerebral atrophy. No ventricular dilatation. Patchy low-attenuation change in the deep white matter consistent with small vessel ischemia. No mass effect or midline shift. No abnormal extra-axial fluid collections. Gray-white matter junctions are  distinct. Basal cisterns are not effaced. No evidence of acute intracranial hemorrhage. No depressed skull fractures. Mild mucosal thickening in the paranasal sinuses. No acute air-fluid levels. Mastoid air cells are not opacified. Vascular calcifications. CT CERVICAL SPINE FINDINGS Examination is technically limited due to motion artifact. Straightening of the usual cervical lordosis. This appearance is nonspecific and may be due to patient positioning or degenerative change but ligamentous injury or muscle spasm could also have this appearance inner not excluded. Diffuse degenerative changes throughout the cervical spine with narrowed cervical interspaces and associated endplate hypertrophic changes. Degenerative changes throughout the cervical facet joints. No anterior subluxation. No vertebral compression deformities. No prevertebral soft tissue swelling. C1-2 articulation appears intact. Vascular calcifications. IMPRESSION: No  acute intracranial abnormalities. Chronic atrophy and small vessel ischemic changes. Nonspecific straightening of usual cervical lordosis. Diffuse degenerative changes in the cervical spine. No acute displaced fractures identified. Electronically Signed   By: Lucienne Capers M.D.   On: 04/04/2015 06:38   Ct Cervical Spine Wo Contrast  04/04/2015  CLINICAL DATA:  Fall 36 hours ago. New shortness of breath after the fall. EXAM: CT HEAD WITHOUT CONTRAST CT CERVICAL SPINE WITHOUT CONTRAST TECHNIQUE: Multidetector CT imaging of the head and cervical spine was performed following the standard protocol without intravenous contrast. Multiplanar CT image reconstructions of the cervical spine were also generated. COMPARISON:  None. FINDINGS: CT HEAD FINDINGS Diffuse cerebral atrophy. No ventricular dilatation. Patchy low-attenuation change in the deep white matter consistent with small vessel ischemia. No mass effect or midline shift. No abnormal extra-axial fluid collections. Gray-white matter  junctions are distinct. Basal cisterns are not effaced. No evidence of acute intracranial hemorrhage. No depressed skull fractures. Mild mucosal thickening in the paranasal sinuses. No acute air-fluid levels. Mastoid air cells are not opacified. Vascular calcifications. CT CERVICAL SPINE FINDINGS Examination is technically limited due to motion artifact. Straightening of the usual cervical lordosis. This appearance is nonspecific and may be due to patient positioning or degenerative change but ligamentous injury or muscle spasm could also have this appearance inner not excluded. Diffuse degenerative changes throughout the cervical spine with narrowed cervical interspaces and associated endplate hypertrophic changes. Degenerative changes throughout the cervical facet joints. No anterior subluxation. No vertebral compression deformities. No prevertebral soft tissue swelling. C1-2 articulation appears intact. Vascular calcifications. IMPRESSION: No acute intracranial abnormalities. Chronic atrophy and small vessel ischemic changes. Nonspecific straightening of usual cervical lordosis. Diffuse degenerative changes in the cervical spine. No acute displaced fractures identified. Electronically Signed   By: Lucienne Capers M.D.   On: 04/04/2015 06:38   US Abdomen Complete  04/04/2015  CLINICAL DATA:  Fall, morbid obesity EXAM: ABDOMEN ULTRASOUND COMPLETE COMPARISON:  CT 12/15/2014 FINDINGS: Gallbladder: No gallstones or wall thickening visualized. No sonographic Murphy sign noted by sonographer.The gallbladder is partially collapsed. Common bile duct: Diameter: Normal diameter 5.3 mm. Liver: No focal lesion identified. Within normal limits in parenchymal echogenicity. IVC: No abnormality visualized. Pancreas: Visualized portion unremarkable. Spleen: Not well visualized due to body habitus Right Kidney: Length: 11.6.  No hydronephrosis Left Kidney: Length: Not visualized. Abdominal aorta: No aneurysm visualized. Other  findings: None. IMPRESSION: 1. Limited exam due to patient body habitus. The spleen is not identified. 2. No acute findings in the abdomen identified. Electronically Signed   By: Suzy Bouchard M.D.   On: 04/04/2015 07:19   Dg Chest Portable 1 View  04/04/2015  CLINICAL DATA:  [place] tonight, shortness of breath. History of diabetes. EXAM: PORTABLE CHEST 1 VIEW COMPARISON:  Chest radiograph December 14, 2014 FINDINGS: Patient is rotated LEFT. The cardiac silhouette appears mildly enlarged, calcified aortic knob. Pulmonary vascular congestion. Retrocardiac consolidation with small LEFT pleural effusion. Strandy densities RIGHT lung base. No pneumothorax. Large body habitus. Osseous structures are nonsuspicious. Surgical clips project in LEFT upper quadrant. IMPRESSION: Mild cardiomegaly and pulmonary vascular congestion. Retrocardiac consolidation and small LEFT pleural effusion. RIGHT lung base atelectasis. Electronically Signed   By: Elon Alas M.D.   On: 04/04/2015 04:13    Microbiology: Recent Results (from the past 240 hour(s))  MRSA PCR Screening     Status: Abnormal   Collection Time: 04/04/15  8:51 AM  Result Value Ref Range Status   MRSA by PCR POSITIVE (A) NEGATIVE  Final    Comment:        The GeneXpert MRSA Assay (FDA approved for NASAL specimens only), is one component of a comprehensive MRSA colonization surveillance program. It is not intended to diagnose MRSA infection nor to guide or monitor treatment for MRSA infections. RESULT CALLED TO, READ BACK BY AND VERIFIED WITH: L. BASS RN 11:30 04/04/15 ([email or web address])   Culture, blood (routine x 2)     Status: None (Preliminary result)   Collection Time: 04/04/15  2:24 PM  Result Value Ref Range Status   Specimen Description BLOOD RIGHT HAND  Final   Special Requests BOTTLES DRAWN AEROBIC ONLY 3CC  Final   Culture NO GROWTH 4 DAYS  Final   Report Status PENDING  Incomplete  Culture, blood (routine x 2)     Status: None  (Preliminary result)   Collection Time: 04/04/15  2:26 PM  Result Value Ref Range Status   Specimen Description BLOOD RIGHT HAND  Final   Special Requests BOTTLES DRAWN AEROBIC ONLY 3CC  Final   Culture NO GROWTH 4 DAYS  Final   Report Status PENDING  Incomplete     Labs: Basic Metabolic Panel:  Recent Labs Lab 04/04/15 2334 04/05/15 1222 04/06/15 0815 04/07/15 1016 04/08/15 1048  NA 143 145 144 140 140  K 6.4* 5.8* 5.3* 5.3* 5.2*  CL 110 108 107 104 106  CO2 19* 25 25 25 24   GLUCOSE 143* 133* 159* 244* 264*  BUN 72* 65* 56* 47* 41*  CREATININE 5.37* 4.76* 4.25* 3.53* 2.94*  CALCIUM 7.6* 7.5* 7.5* 8.0* 8.5*  PHOS  --  6.4*  --   --   --    Liver Function Tests:  Recent Labs Lab 04/04/15 0231 04/05/15 1222  AST 13*  --   ALT 14*  --   ALKPHOS 80  --   BILITOT 0.2*  --   PROT 6.5  --   ALBUMIN 2.6* 2.1*   No results for input(s): LIPASE, AMYLASE in the last 168 hours. No results for input(s): AMMONIA in the last 168 hours. CBC:  Recent Labs Lab 04/04/15 0231 04/04/15 0513 04/04/15 0522  WBC 6.0  --  5.9  NEUTROABS 4.2  --  4.2  HGB 8.5* 9.2* 8.2*  HCT 28.1* 27.0* 27.2*  MCV 89.2  --  88.9  PLT 189  --  204   Cardiac Enzymes:  Recent Labs Lab 04/04/15 0521  CKTOTAL 186   BNP: BNP (last 3 results)  Recent Labs  04/04/15 0231  BNP 147.0*    ProBNP (last 3 results) No results for input(s): PROBNP in the last 8760 hours.  CBG:  Recent Labs Lab 04/04/15 1959 04/05/15 0001 04/06/15 2350 04/07/15 2203 04/08/15 2210  GLUCAP 208* 129* 205* 232* 295*    Signed:  Velvet Bathe MD.  [facility] 04/09/2015, 2:15 PM    Medically stable for discharge once bed available. 04/11/15  Velvet Bathe

## 2015-04-10 MED ORDER — FLUCONAZOLE 100 MG PO TABS
100.0000 mg | ORAL_TABLET | Freq: Every day | ORAL | Status: DC
  Administered 2015-04-10 – 2015-04-11 (×2): 100 mg via ORAL
  Filled 2015-04-10 (×2): qty 1

## 2015-04-10 NOTE — NC FL2 (Signed)
[place] LEVEL OF CARE SCREENING TOOL     IDENTIFICATION  Patient Name: Dale Henry Birthdate: Mar 21, 1945 Sex: male Admission Date (Current Location): 04/04/2015  [facility] and [place] Number:  Herbalist and Address:  [facility], [place] [address] [place], [place] [address]      Provider Number:    Attending Physician Name and Address:  Velvet Bathe, MD  Relative Name and Phone Number:       Current Level of Care: Hospital Recommended Level of Care: [place] Prior Approval Number:    Date Approved/Denied:   PASRR Number: [id number]  Discharge Plan: SNF    Current Diagnoses: Patient Active Problem List   Diagnosis Date Noted  . Acute delirium 04/07/2015  . Bladder cancer ([place]) 04/07/2015  . Venous stasis dermatitis 04/07/2015  . Fall   . Acute on chronic renal failure ([place]) 04/04/2015  . Cellulitis and abscess of trunk 04/04/2015  . Renal insufficiency 10/14/2014  . Acute renal failure ([place]) 10/14/2014  . Hyperkalemia   . Knee pain, acute     Orientation RESPIRATION BLADDER Height & Weight     Self, Time, Situation, Place  Normal Indwelling catheter Weight: (!) 427 lb (193.686 kg) Height:  5\' 5"  (165.1 cm)  BEHAVIORAL SYMPTOMS/MOOD NEUROLOGICAL BOWEL NUTRITION STATUS      Continent    AMBULATORY STATUS COMMUNICATION OF NEEDS Skin   Extensive Assist Verbally                         Personal Care Assistance Level of Assistance  Bathing, Dressing Bathing Assistance: Maximum assistance   Dressing Assistance: Maximum assistance     Functional Limitations Info             SPECIAL CARE FACTORS FREQUENCY                       Contractures Contractures Info: Not present    Additional Factors Info  Isolation Precautions, Allergies, Code Status Code Status Info: DNR Allergies Info: codeine, shellfish     Isolation Precautions Info: MRSA     Current Medications  (04/10/2015):  This is the current hospital active medication list Current Facility-Administered Medications  Medication Dose Route Frequency Provider Last Rate Last Dose  . 0.9 %  sodium chloride infusion   Intravenous Continuous Raylene Miyamoto, MD 10 mL/hr at 04/06/15 1214    . camphor-menthol (SARNA) lotion   Topical PRN Acquanetta Chain, DO      . diphenhydrAMINE (BENADRYL) capsule 25 mg  25 mg Oral Q8H PRN Colbert Coyer, MD   25 mg at 04/10/15 1608  . fentaNYL (SUBLIMAZE) injection 25 mcg  25 mcg Intravenous Q1H PRN Acquanetta Chain, DO      . fluconazole (DIFLUCAN) tablet 100 mg  100 mg Oral Daily Velvet Bathe, MD   100 mg at 04/10/15 1607  . haloperidol lactate (HALDOL) injection 1 mg  1 mg Intravenous Q4H PRN Acquanetta Chain, DO      . hydrocerin (EUCERIN) cream 1 application  1 application Topical Daily Velvet Bathe, MD   1 application at [phone]  . HYDROcodone-acetaminophen (NORCO) 10-325 MG per tablet 1 tablet  1 tablet Oral Q4H PRN Acquanetta Chain, DO   1 tablet at 04/08/15 [phone]  . nystatin (MYCOSTATIN/NYSTOP) topical powder   Topical TID Velvet Bathe, MD      . ondansetron [facility]) injection  4 mg  4 mg Intravenous Q6H PRN Barton Dubois, MD   4 mg at 04/08/15 1550  . oxyCODONE (OXYCONTIN) 12 hr tablet 15 mg  15 mg Oral Q12H Velvet Bathe, MD   15 mg at 04/10/15 1313  . senna (SENOKOT) tablet 8.6 mg  1 tablet Oral QHS PRN Acquanetta Chain, DO         Discharge Medications: Please see discharge summary for a list of discharge medications.  Relevant Imaging Results:  Relevant Lab Results:   Additional Information    Moshe Cipro Berneice Heinrich, LCSW

## 2015-04-10 NOTE — Progress Notes (Signed)
Vital signs stable. Awaiting placement into SNF.  Dale Henry, [name]

## 2015-04-10 NOTE — Clinical Social Work Note (Signed)
Clinical Social Work Assessment  Patient Details  Name: ZEPHYR RIDLEY MRN: [id number] Date of Birth: Aug 04, 1945  Date of referral:  04/10/15               Reason for consult:  Facility Placement                Permission sought to share information with:  Family Supports Permission granted to share information::  Yes, Verbal Permission Granted  Name::     Doctor, hospital  Agency::  [facility] SNFs  Relationship::  wife  Contact Information:  [phone]  Housing/Transportation Living arrangements for the past 2 months:  [place], [place] of Information:  Patient, Spouse Patient Interpreter Needed:  None Criminal Activity/Legal Involvement Pertinent to Current Situation/Hospitalization:  No - Comment as needed Significant Relationships:  Spouse Lives with:  Spouse Do you feel safe going back to the place where you live?  Yes Need for family participation in patient care:  No (Coment)  Care giving concerns:  None, at this time.   Social Worker assessment / plan:  Met with Pt and spouse to discuss d/c plan.  Pt stated that he would love to go home but understands that he can't now, as he's not able to walk independently.  Pt has been to [facility] before and is willing to return, however he would like for SW to explore all of [facility] facilities.    Employment status:  Retired Nurse, adult PT Recommendations:  [place] / Referral to community resources:   SNF list    Patient/Family's Response to care:  Pt and spouse seemed pleased with Pt's care.  Patient/Family's Understanding of and Emotional Response to Diagnosis, Current Treatment, and Prognosis:  Pt and spouse feel that SNF is the only option at this point, as Pt isn't able to ambulate independently.  Emotional Assessment Appearance:  Appears stated age Attitude/Demeanor/Rapport:   (calm) Affect (typically observed):   Accepting Orientation:  Oriented to Self, Oriented to Place, Oriented to  Time, Oriented to Situation Alcohol / Substance use:  Never Used Psych involvement (Current and /or in the community):  No (Comment)  Discharge Needs  Concerns to be addressed:  No discharge needs identified Readmission within the last 30 days:  No Current discharge risk:  None Barriers to Discharge:  No Barriers Identified   Matilde Bash, [place] 04/10/2015, 4:12 PM

## 2015-04-10 NOTE — Evaluation (Addendum)
Occupational Therapy Evaluation Patient Details Name: Dale Henry MRN: [id number] DOB: Jul 06, 1945 Today's Date: 04/10/2015    History of Present Illness Adm 2/13 for difficulty breathing. Pt fell at home several days prior and breathing worsened after this fall. Pt also with acute on chronic renal failure (not an HD candidate), supratherapeutic INR, cellulitis of pannus, and bradycardia. Patient has been treated medically and Palliative Consult was made. Renal function has improved. PMHx- morbid obesity, DM, renal failure,    Clinical Impression   This 70 yo male admitted with above presents to acute OT with deficits below affecting his ability to help care for himself as he was pta. He will benefit from acute OT with follow up OT at SNF to get more mobile and thus be able to A more with his basic ADLs    Follow Up Recommendations  SNF    Equipment Recommendations  Other (comment) (larger 3n1)       Precautions / Restrictions Precautions Precautions: Fall Restrictions Weight Bearing Restrictions: No      Mobility Bed Mobility Overal bed mobility: Needs Assistance Bed Mobility: Supine to Sit;Sit to Supine     Supine to sit: Mod assist;HOB elevated (use of rail and air mattress deflated) Sit to supine: Mod assist;+2 for physical assistance      Transfers Overall transfer level: Needs assistance Equipment used:  (recliner back in front of him holding onto handles) Transfers: Sit to/from Stand Sit to Stand: Mod assist;+2 physical assistance         General transfer comment: Pt stood for 5 minutes at EOB and then took 5 small pivotal steps laterally to [place] Overall balance assessment: Needs assistance Sitting-balance support: No upper extremity supported;Feet supported Sitting balance-Leahy Scale: Fair     Standing balance support: Single extremity supported Standing balance-Leahy Scale: Poor Standing balance comment: Pt required at least one hand on  back of recliner for support                            ADL Overall ADL's : Needs assistance/impaired Eating/Feeding: Independent;Sitting   Grooming: Minimal assistance;Sitting   Upper Body Bathing: Maximal assistance;Sitting (due to body habitus)   Lower Body Bathing: Total assistance;Bed level   Upper Body Dressing : Maximal assistance;Sitting (due to body habitus)   Lower Body Dressing: Total assistance;Bed level           Tub/ Shower Transfer: Total assistance (with Mod A +2 sit<>stand)                     Pertinent Vitals/Pain Pain Assessment: No/denies pain     Hand Dominance Right   Extremity/Trunk Assessment Upper Extremity Assessment Upper Extremity Assessment: Generalized weakness;RUE deficits/detail RUE Deficits / Details: very limited shoulder ROM (torn rotator cuff per pt); elbow and hand WFL RUE Coordination: decreased gross motor           Communication Communication Communication: HOH   Cognition Arousal/Alertness: Awake/alert Behavior During Therapy: WFL for tasks assessed/performed Overall Cognitive Status: Within Functional Limits for tasks assessed                                Home Living Family/patient expects to be discharged to:: Skilled nursing facility  OT Diagnosis: Generalized weakness   OT Problem List: Decreased strength;Decreased range of motion;Impaired balance (sitting and/or standing);Impaired UE functional use (morbid obesity)   OT Treatment/Interventions: Self-care/ADL training;Patient/family education;Balance training;Therapeutic activities;DME and/or AE instruction    OT Goals(Current goals can be found in the care plan section) Acute Rehab OT Goals Patient Stated Goal: to return home and walk again OT Goal Formulation: With patient Time For Goal Achievement: 04/17/15 Potential to Achieve Goals: Good ADL Goals Pt Will  Perform Grooming: with set-up;sitting (EOB 2 tasks) Pt Will Transfer to Toilet: with mod assist;stand pivot transfer;bedside commode Pt Will Perform Toileting - Clothing Manipulation and hygiene:  (Pt will be able to stand with Mod A +1 to A with these tasks) Pt/caregiver will Perform Home Exercise Program: Increased ROM;Increased strength;Both right and left upper extremity;With minimal assist (AROM/AAROM/resistive Level 1-if can tolerate) Additional ADL Goal #1: Pt will be min guard A for up to EOB with rail, HOB up, and air out of mattress  OT Frequency: Min 2X/week              End of Session Equipment Utilized During Treatment: Gait belt Nurse Communication: Mobility status (had a scab on front lf left leg that came off and it is bleeding)  Activity Tolerance: Patient tolerated treatment well Patient left: in bed;with call bell/phone within reach   Time: 1019-1101 OT Time Calculation (min): 42 min Charges:  OT General Charges $OT Visit: 1 Procedure OT Evaluation $OT Eval Moderate Complexity: 1 Procedure OT Treatments $Therapeutic Activity: 23-37 mins  Almon Register [id number] 04/10/2015, 11:15 AM

## 2015-04-10 NOTE — Clinical Social Work Placement (Signed)
   CLINICAL SOCIAL WORK PLACEMENT  NOTE  Date:  04/10/2015  Patient Details  Name: Dale Henry MRN: [id number] Date of Birth: January 16, 1946  Clinical Social Work is seeking post-discharge placement for this patient at the [place] level of care (*CSW will initial, date and re-position this form in  chart as items are completed):  Yes   Patient/family provided with [facility] Work Department's list of facilities offering this level of care within the geographic area requested by the patient (or if unable, by the patient's family).  Yes   Patient/family informed of their freedom to choose among providers that offer the needed level of care, that participate in Medicare, Medicaid or managed care program needed by the patient, have an available bed and are willing to accept the patient.  Yes   Patient/family informed of [facility]'s ownership interest in [facility] and [facility], as well as of the fact that they are under no obligation to receive care at these facilities.  PASRR submitted to EDS on       PASRR number received on       Existing PASRR number confirmed on 04/10/15     FL2 transmitted to all facilities in geographic area requested by pt/family on 04/10/15     FL2 transmitted to all facilities within larger geographic area on       Patient informed that his/her managed care company has contracts with or will negotiate with certain facilities, including the following:            Patient/family informed of bed offers received.  Patient chooses bed at       Physician recommends and patient chooses bed at      Patient to be transferred to   on  .  Patient to be transferred to facility by       Patient family notified on   of transfer.  Name of family member notified:        PHYSICIAN       Additional Comment:    _______________________________________________ Matilde Bash, [place] 04/10/2015, 4:14 PM

## 2015-04-10 NOTE — Progress Notes (Signed)
Antibiotic CONSULT NOTE - Follow Up Consult  Pharmacy Consult for Fluconazole Indication: topical fungal infection   Allergies  Allergen Reactions  . Codeine Itching  . Shellfish Allergy Nausea Only    Patient Measurements: Height: 5\' 5"  (165.1 cm) Weight: (!) 427 lb (193.686 kg) IBW/kg (Calculated) : 61.5  Vital Signs: Temp: 98.6 F (37 C) (02/19 0508) Temp Source: Oral (02/19 0508) BP: 139/62 mmHg (02/19 0508) Pulse Rate: 84 (02/19 0508)  Labs:  Recent Labs  04/07/15 1016 04/08/15 1048  CREATININE 3.53* 2.94*    Estimated Creatinine Clearance: 37.3 mL/min (by C-G formula based on Cr of 2.94).   Medications:  Scheduled:  . fluconazole  100 mg Oral Daily  . hydrocerin  1 application Topical Daily  . nystatin   Topical TID  . oxyCODONE  15 mg Oral Q12H   Infusions:  . sodium chloride 10 mL/hr at 04/06/15 1214   PRN: camphor-menthol, diphenhydrAMINE, fentaNYL (SUBLIMAZE) injection, haloperidol lactate, HYDROcodone-acetaminophen, ondansetron (ZOFRAN) IV, senna  Assessment: 82 YOF with fungal infection of abdomen, groin, and pannus.  MD plans to discharge on fluconazole daily for 6 days.  Currently afebrile.   Vanc 2/13 >> 2/14  Zosyn 2/13 >> 2/14  Fluconazole 2/16>> (2/24) Nystatin powder 2/14>>  CHG/Bactroban 2/13>>(2/17)   Plan:  Fluconazole 100 mg daily PO until 2/24 Monitor for s/sx of worsening infection Pharmacy will sign off, please reconsult if needed.   Bennye Alm, PharmD Pharmacy Resident [phone] 04/10/2015,10:07 AM

## 2015-04-11 NOTE — Progress Notes (Signed)
Spoke with SW in regards to pt's discharge; per SW pt will d/c to SNF today after bariatric bed has bed delivered to facilty. Per written order, will remove foley.

## 2015-04-11 NOTE — Progress Notes (Signed)
Attempted report to [employer]; stayed on hold for approx 5 min without receiving an answer. Unable to give report to facility.

## 2015-04-11 NOTE — Care Management Important Message (Signed)
Important Message  Patient Details  Name: Dale Henry MRN: [id number] Date of Birth: 1945/02/27   Medicare Important Message Given:  Yes    Yashvi Jasinski, Rory Percy, RN 04/11/2015, 10:55 AM

## 2015-04-11 NOTE — Clinical Social Work Note (Signed)
Patient will discharge today per MD order. [facility] will [facility] to[facility] RN to call report prior to transportation to: [phone] Transportation: [facility]- to be called once facility has bari bed in house.  CSW sent discharge summary to SNF for review.  CSW reviewed dc plans with wife at bedside.  All parties agreeable to dc today.  Nonnie Done, LCSW [phone]  5N1-9, 2S 15-16 and Psychiatric Service Line  Licensed Clinical Social Worker

## 2015-04-12 ENCOUNTER — Encounter: Payer: Self-pay | Admitting: Internal Medicine

## 2015-04-12 ENCOUNTER — Non-Acute Institutional Stay (SKILLED_NURSING_FACILITY): Payer: Medicare Other | Admitting: Internal Medicine

## 2015-04-12 DIAGNOSIS — K5900 Constipation, unspecified: Secondary | ICD-10-CM | POA: Diagnosis not present

## 2015-04-12 DIAGNOSIS — I8311 Varicose veins of right lower extremity with inflammation: Secondary | ICD-10-CM

## 2015-04-12 DIAGNOSIS — I8312 Varicose veins of left lower extremity with inflammation: Secondary | ICD-10-CM | POA: Diagnosis not present

## 2015-04-12 DIAGNOSIS — I10 Essential (primary) hypertension: Secondary | ICD-10-CM | POA: Diagnosis not present

## 2015-04-12 DIAGNOSIS — N184 Chronic kidney disease, stage 4 (severe): Secondary | ICD-10-CM | POA: Diagnosis not present

## 2015-04-12 DIAGNOSIS — L02219 Cutaneous abscess of trunk, unspecified: Secondary | ICD-10-CM

## 2015-04-12 DIAGNOSIS — Z8603 Personal history of neoplasm of uncertain behavior: Secondary | ICD-10-CM

## 2015-04-12 DIAGNOSIS — Z87448 Personal history of other diseases of urinary system: Secondary | ICD-10-CM

## 2015-04-12 DIAGNOSIS — I872 Venous insufficiency (chronic) (peripheral): Secondary | ICD-10-CM

## 2015-04-12 DIAGNOSIS — E785 Hyperlipidemia, unspecified: Secondary | ICD-10-CM | POA: Diagnosis not present

## 2015-04-12 DIAGNOSIS — L03319 Cellulitis of trunk, unspecified: Secondary | ICD-10-CM | POA: Diagnosis not present

## 2015-04-12 NOTE — Progress Notes (Signed)
Patient ID: Dale Henry, male   DOB: 1945-06-11, 70 y.o.   MRN: [id number]    HISTORY AND PHYSICAL   DATE: 04/12/15  Location:  [place] of Service: SNF [phone])   Extended Emergency Contact Information Primary Emergency Contact: Shameek, Chalas Address: [address]          [place] [address] [place] Phone: [phone] Mobile Phone: [phone] Relation: Spouse  Advanced Directive information  FULL CODE  Chief Complaint  Patient presents with  . New Admit To SNF    HPI:  70 yo male seen today as a new admission into SNF following hospital stay for acute/CKD, hyperkalemia, cellulitis and abscess of trunk, acute delirium, venous stasis dermatitis, bladder CA. he had fallen at home a few days prior to ED presentation but refused EMS transport. Cr 5.39 on admission --->2.94 at d/c. K+ >7.5 with ECG changes (sinus brady and widened QRS) --> 5.2 at d/c. INR 2.61-->2.04 at d/c. Albumin 2.6. LFTs nml. Hgb 8.2. Ck and Trp nml.   DM - last A1c 6.8%in Aug 2016. Takes inuslin. No low BS reactions. CBG 323 today. 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 takes flomax. Followed by urology Dr Felipa Eth.   Past Medical History  Diagnosis Date  . Morbid obesity ([place])   . Diabetes mellitus without complication ([place])     No past surgical history on file.  Patient Care Team: Provider Not In System as PCP - General  Social History   Social History  . Marital Status: Married    Spouse Name: N/A  . Number of Children: N/A  . Years of Education: N/A   Occupational History  . Not on file.   Social History Main Topics  . Smoking status: Never Smoker   . Smokeless tobacco: Never Used  . Alcohol Use: No  . Drug  Use: No  . Sexual Activity: No   Other Topics Concern  . Not on file   Social History Narrative     reports that he has never smoked. He has never used smokeless tobacco. He reports that he does not drink alcohol or use illicit drugs.  No family history on file. No family status information on file.    There is no immunization history for the selected administration types on file for this patient.  Allergies  Allergen Reactions  . Codeine Itching  . Shellfish Allergy Nausea Only    Medications: Patient's Medications  New Prescriptions   No medications on file  Previous Medications   ATORVASTATIN (LIPITOR) 20 MG TABLET    Take 20 mg by mouth at bedtime.   DOCUSATE SODIUM (COLACE) 100 MG CAPSULE    Take 100 mg by mouth 2 (two) times daily.   FERROUS SULFATE 325 (65 FE) MG TABLET    Take 325 mg by mouth daily with breakfast.   FLUCONAZOLE (DIFLUCAN) 100 MG TABLET    Take 1 tablet (100 mg total) by mouth daily.   GABAPENTIN (NEURONTIN) 100 MG CAPSULE    Take 100 mg by mouth 2 (two) times daily.   HYDROCODONE-ACETAMINOPHEN (NORCO) 10-325 MG TABLET    Take 1 tablet by mouth every 4 (four) hours as needed for moderate pain.   HYDROXYZINE (ATARAX/VISTARIL)  25 MG TABLET    Take 25 mg by mouth 2 (two) times daily.   INSULIN REGULAR (NOVOLIN R,HUMULIN R) 100 UNITS/ML INJECTION    Inject 0-10 Units into the skin 3 (three) times daily before meals. Per sliding scale   NYSTATIN (MYCOSTATIN/NYSTOP) 100000 UNIT/GM POWD    As directed per package insert 3 times daily to affected area   OXYCODONE (OXYCONTIN) 15 MG 12 HR TABLET    Take 1 tablet (15 mg total) by mouth every 12 (twelve) hours.   PROBIOTIC CAPS    Take 1 capsule by mouth daily.   SENNA (SENOKOT) 8.6 MG TABS TABLET    Take 1 tablet (8.6 mg total) by mouth at bedtime as needed for mild constipation.   TAMSULOSIN (FLOMAX) 0.4 MG CAPS CAPSULE    Take 0.4 mg by mouth daily.  Modified Medications   No medications on file  Discontinued  Medications   No medications on file    Review of Systems  Unable to perform ROS: Other  pt HOH  Filed Vitals:   04/12/15 1105  BP: 154/77  Pulse: 77  Temp: 99 F (37.2 C)  Weight: 377 lb (171.006 kg)  SpO2: 95%   Body mass index is 62.74 kg/(m^2).  Physical Exam  Constitutional: He appears well-developed.  Looks pale in NAD, sitting up in bed  HENT:  Mouth/Throat: Oropharynx is clear and moist.  Eyes: Pupils are equal, round, and reactive to light. No scleral icterus.  Neck: Neck supple. Carotid bruit is not present. No thyromegaly present.  Cardiovascular: Normal rate, regular rhythm, normal heart sounds and intact distal pulses.  Exam reveals no gallop and no friction rub.   No murmur heard. no distal LE swelling. No calf TTP  Pulmonary/Chest: Effort normal and breath sounds normal. He has no wheezes. He has no rales. He exhibits no tenderness.  Abdominal: Soft. Bowel sounds are normal. He exhibits no distension, no abdominal bruit, no pulsatile midline mass and no mass. There is no tenderness. There is no rebound and no guarding.  obese  Musculoskeletal: He exhibits edema and tenderness.  Lymphadenopathy:    He has no cervical adenopathy.  Neurological: He is alert.  Skin: Skin is warm and dry. Rash noted. There is erythema.  Chronic venous stasis changes on b/l LE; moist angry appearing rash abdominal pannus L>R and into inguinal b.l  Psychiatric: He has a normal mood and affect. His behavior is normal.     Labs reviewed: Admission on 04/04/2015, Discharged on 04/11/2015  No results displayed because visit has over 200 results.  CBC Latest Ref Rng 04/04/2015 04/04/2015 04/04/2015  WBC 4.0 - 10.5 K/uL 5.9 - 6.0  Hemoglobin 13.0 - 17.0 g/dL 8.2(L) 9.2(L) 8.5(L)  Hematocrit 39.0 - 52.0 % 27.2(L) 27.0(L) 28.1(L)  Platelets 150 - 400 K/uL 204 - 189    CMP Latest Ref Rng 04/08/2015 04/07/2015 04/06/2015  Glucose 65 - 99 mg/dL 264(H) 244(H) 159(H)  BUN 6 - 20 mg/dL 41(H)  47(H) 56(H)  Creatinine 0.61 - 1.24 mg/dL 2.94(H) 3.53(H) 4.25(H)  Sodium 135 - 145 mmol/L 140 140 144  Potassium 3.5 - 5.1 mmol/L 5.2(H) 5.3(H) 5.3(H)  Chloride 101 - 111 mmol/L 106 104 107  CO2 22 - 32 mmol/L 24 25 25   Calcium 8.9 - 10.3 mg/dL 8.5(L) 8.0(L) 7.5(L)  Total Protein 6.5 - 8.1 g/dL - - -  Total Bilirubin 0.3 - 1.2 mg/dL - - -  Alkaline Phos 38 - 126 U/L - - -  AST  15 - 41 U/L - - -  ALT 17 - 63 U/L - - -        Ct Head Wo Contrast  04/04/2015  CLINICAL DATA:  Fall 36 hours ago. New shortness of breath after the fall. EXAM: CT HEAD WITHOUT CONTRAST CT CERVICAL SPINE WITHOUT CONTRAST TECHNIQUE: Multidetector CT imaging of the head and cervical spine was performed following the standard protocol without intravenous contrast. Multiplanar CT image reconstructions of the cervical spine were also generated. COMPARISON:  None. FINDINGS: CT HEAD FINDINGS Diffuse cerebral atrophy. No ventricular dilatation. Patchy low-attenuation change in the deep white matter consistent with small vessel ischemia. No mass effect or midline shift. No abnormal extra-axial fluid collections. Gray-white matter junctions are distinct. Basal cisterns are not effaced. No evidence of acute intracranial hemorrhage. No depressed skull fractures. Mild mucosal thickening in the paranasal sinuses. No acute air-fluid levels. Mastoid air cells are not opacified. Vascular calcifications. CT CERVICAL SPINE FINDINGS Examination is technically limited due to motion artifact. Straightening of the usual cervical lordosis. This appearance is nonspecific and may be due to patient positioning or degenerative change but ligamentous injury or muscle spasm could also have this appearance inner not excluded. Diffuse degenerative changes throughout the cervical spine with narrowed cervical interspaces and associated endplate hypertrophic changes. Degenerative changes throughout the cervical facet joints. No anterior subluxation. No  vertebral compression deformities. No prevertebral soft tissue swelling. C1-2 articulation appears intact. Vascular calcifications. IMPRESSION: No acute intracranial abnormalities. Chronic atrophy and small vessel ischemic changes. Nonspecific straightening of usual cervical lordosis. Diffuse degenerative changes in the cervical spine. No acute displaced fractures identified. Electronically Signed   By: Lucienne Capers M.D.   On: 04/04/2015 06:38   Ct Cervical Spine Wo Contrast  04/04/2015  CLINICAL DATA:  Fall 36 hours ago. New shortness of breath after the fall. EXAM: CT HEAD WITHOUT CONTRAST CT CERVICAL SPINE WITHOUT CONTRAST TECHNIQUE: Multidetector CT imaging of the head and cervical spine was performed following the standard protocol without intravenous contrast. Multiplanar CT image reconstructions of the cervical spine were also generated. COMPARISON:  None. FINDINGS: CT HEAD FINDINGS Diffuse cerebral atrophy. No ventricular dilatation. Patchy low-attenuation change in the deep white matter consistent with small vessel ischemia. No mass effect or midline shift. No abnormal extra-axial fluid collections. Gray-white matter junctions are distinct. Basal cisterns are not effaced. No evidence of acute intracranial hemorrhage. No depressed skull fractures. Mild mucosal thickening in the paranasal sinuses. No acute air-fluid levels. Mastoid air cells are not opacified. Vascular calcifications. CT CERVICAL SPINE FINDINGS Examination is technically limited due to motion artifact. Straightening of the usual cervical lordosis. This appearance is nonspecific and may be due to patient positioning or degenerative change but ligamentous injury or muscle spasm could also have this appearance inner not excluded. Diffuse degenerative changes throughout the cervical spine with narrowed cervical interspaces and associated endplate hypertrophic changes. Degenerative changes throughout the cervical facet joints. No anterior  subluxation. No vertebral compression deformities. No prevertebral soft tissue swelling. C1-2 articulation appears intact. Vascular calcifications. IMPRESSION: No acute intracranial abnormalities. Chronic atrophy and small vessel ischemic changes. Nonspecific straightening of usual cervical lordosis. Diffuse degenerative changes in the cervical spine. No acute displaced fractures identified. Electronically Signed   By: Lucienne Capers M.D.   On: 04/04/2015 06:38   US Abdomen Complete  04/04/2015  CLINICAL DATA:  Fall, morbid obesity EXAM: ABDOMEN ULTRASOUND COMPLETE COMPARISON:  CT 12/15/2014 FINDINGS: Gallbladder: No gallstones or wall thickening visualized. No sonographic Murphy sign noted by  sonographer.The gallbladder is partially collapsed. Common bile duct: Diameter: Normal diameter 5.3 mm. Liver: No focal lesion identified. Within normal limits in parenchymal echogenicity. IVC: No abnormality visualized. Pancreas: Visualized portion unremarkable. Spleen: Not well visualized due to body habitus Right Kidney: Length: 11.6.  No hydronephrosis Left Kidney: Length: Not visualized. Abdominal aorta: No aneurysm visualized. Other findings: None. IMPRESSION: 1. Limited exam due to patient body habitus. The spleen is not identified. 2. No acute findings in the abdomen identified. Electronically Signed   By: Suzy Bouchard M.D.   On: 04/04/2015 07:19   Dg Chest Portable 1 View  04/04/2015  CLINICAL DATA:  [place] tonight, shortness of breath. History of diabetes. EXAM: PORTABLE CHEST 1 VIEW COMPARISON:  Chest radiograph December 14, 2014 FINDINGS: Patient is rotated LEFT. The cardiac silhouette appears mildly enlarged, calcified aortic knob. Pulmonary vascular congestion. Retrocardiac consolidation with small LEFT pleural effusion. Strandy densities RIGHT lung base. No pneumothorax. Large body habitus. Osseous structures are nonsuspicious. Surgical clips project in LEFT upper quadrant. IMPRESSION: Mild cardiomegaly  and pulmonary vascular congestion. Retrocardiac consolidation and small LEFT pleural effusion. RIGHT lung base atelectasis. Electronically Signed   By: Elon Alas M.D.   On: 04/04/2015 04:13     Assessment/Plan   ICD-9-CM ICD-10-CM   1. Constipation, unspecified constipation type - opioid induced 564.00 K59.00   2. Essential (primary) hypertension - borderline controlled 401.9 I10   3. Cellulitis and abscess of trunk 682.2 L03.319     L02.219   4. Chronic kidney disease, stage IV (severe) (HCC) 585.4 N18.4   5. Venous stasis dermatitis of both lower extremities 454.1 I83.11     I83.12   6. Morbid (severe) obesity due to excess calories ([place]) 278.01 E66.01   7. Hyperlipidemia LDL goal <100 272.4 E78.5   8. History of neoplasm of bladder V13.09 Z87.448    Start hydralazine 25mg  TID for HTN  Cont current meds as ordered  Complete diflucan  Wound care as ordered  Check BMP, A1c, CBC and lipid panel  Keep legs elevated when seated or lying down  Nutritional supplements as indicated  Start miralax daily for constipation  PT/OT as ordered. ST as indicated  Palliative care consult pending  GOAL: short term rehab and d/c home when medically appropriate. Communicated with pt and nursing.  Will follow  Alvon Nygaard S. Perlie Gold  [facility] [address] [place], [place] [address] [phone] Cell (Monday-Friday 8 AM - 5 PM) [phone] After 5 PM and follow prompts

## 2015-04-18 ENCOUNTER — Non-Acute Institutional Stay (SKILLED_NURSING_FACILITY): Payer: Medicare Other | Admitting: Adult Health

## 2015-04-18 ENCOUNTER — Encounter: Payer: Self-pay | Admitting: Adult Health

## 2015-04-18 DIAGNOSIS — E1122 Type 2 diabetes mellitus with diabetic chronic kidney disease: Secondary | ICD-10-CM | POA: Diagnosis not present

## 2015-04-18 DIAGNOSIS — E875 Hyperkalemia: Secondary | ICD-10-CM | POA: Diagnosis not present

## 2015-04-18 DIAGNOSIS — Z794 Long term (current) use of insulin: Secondary | ICD-10-CM | POA: Diagnosis not present

## 2015-04-18 DIAGNOSIS — N184 Chronic kidney disease, stage 4 (severe): Secondary | ICD-10-CM

## 2015-04-18 DIAGNOSIS — E1165 Type 2 diabetes mellitus with hyperglycemia: Secondary | ICD-10-CM | POA: Diagnosis not present

## 2015-04-18 DIAGNOSIS — IMO0002 Reserved for concepts with insufficient information to code with codable children: Secondary | ICD-10-CM | POA: Insufficient documentation

## 2015-04-18 NOTE — Progress Notes (Signed)
Patient ID: Dale Henry, male   DOB: [birth year], 70 y.o.   MRN: [id number]   Facility: [facility]       Allergies  Allergen Reactions  . Codeine Itching  . Shellfish Allergy Nausea Only    Chief Complaint  Patient presents with  . Acute Visit    Multiple Concerns    HPI:  His cbg's are improving since making changes to his diabetes regimen. He is taking NPH 30 units twice daily with novolog 22 units with meals. He is diflucan 100 mg daily completing this regimen today. His fungal rash has improved. His k+ level is normal at 4.9 he had taken kayexalate end of last week. He is complaining of right thigh pain; he does have camping present.     Past Medical History  Diagnosis Date  . Morbid obesity ([place])   . Diabetes mellitus without complication ([place])     History reviewed. No pertinent past surgical history.  VITAL SIGNS BP 126/66 mmHg  Pulse 80  Temp(Src) 97.8 F (36.6 C) (Oral)  Resp 18  Ht 5\' 5"  (1.651 m)  Wt 379 lb (171.913 kg)  BMI 63.07 kg/m2  SpO2 96%  Patient's Medications  New Prescriptions   No medications on file  Previous Medications   ATORVASTATIN (LIPITOR) 20 MG TABLET    Take 20 mg by mouth at bedtime. Reported on 04/18/2015   CETIRIZINE (ZYRTEC) 10 MG TABLET    Take 10 mg by mouth daily.   DOCUSATE SODIUM (COLACE) 100 MG CAPSULE    Take 100 mg by mouth 2 (two) times daily.   FLUCONAZOLE (DIFLUCAN) 100 MG TABLET    Take 1 tablet (100 mg total) by mouth daily.   GABAPENTIN (NEURONTIN) 100 MG CAPSULE    Take 100 mg by mouth 2 (two) times daily.   HYDROCODONE-ACETAMINOPHEN (NORCO) 10-325 MG TABLET    Take 1 tablet by mouth every 4 (four) hours as needed for moderate pain.   HYDROXYZINE (ATARAX/VISTARIL) 25 MG TABLET    Take 25 mg by mouth 2 (two) times daily.   INSULIN ASPART (NOVOLOG) 100 UNIT/ML INJECTION    Inject 22 Units into the skin 3 (three) times daily before meals.   Humulin NPH     Inject 30 Units into the skin 2 (two) times daily before  a meal.    NYSTATIN (MYCOSTATIN/NYSTOP) 100000 UNIT/GM POWD    As directed per package insert 3 times daily to affected area   OXYCODONE (OXYCONTIN) 15 MG 12 HR TABLET    Take 1 tablet (15 mg total) by mouth every 12 (twelve) hours.   PROBIOTIC CAPS    Take 1 capsule by mouth daily.   SENNA (SENOKOT) 8.6 MG TABS TABLET    Take 1 tablet (8.6 mg total) by mouth at bedtime as needed for mild constipation.   TAMSULOSIN (FLOMAX) 0.4 MG CAPS CAPSULE    Take 0.4 mg by mouth daily.   ZINC SULFATE 220 MG CAPSULE    Take 220 mg by mouth daily. X 45 days  Modified Medications   No medications on file  Discontinued Medications     SIGNIFICANT DIAGNOSTIC EXAMS  04-04-15: Mild cardiomegaly and pulmonary vascular congestion. Retrocardiac consolidation and small LEFT pleural effusion. RIGHT lung base atelectasis  04-04-15: ct of head and cervical spine: No acute intracranial abnormalities. Chronic atrophy and small vessel ischemic changes.  Nonspecific straightening of usual cervical lordosis. Diffuse degenerative changes in the cervical spine. No acute displaced fractures identified.  04-04-15: abdominal  ultrasound: 1. Limited exam due to patient body habitus. The spleen is not identified. 2. No acute findings in the abdomen identified   LABS REVIEWED:   04-04-15: wbc 6.0; hgb 8.5; hct 28.1; mcv 89.2; plt 189; glucose 299; bun 77; creat 5.39; k+ >7.5; na++139; ast 13; alt 14; total bili 0.2; albumin 2.6 04-06-15: glucose 159; bun 56; creat 4.25; k+ 5.3; na++144 04-08-15: glucose 264; bun 41; creat 2.94; k+ 5.2; na++140 04-16-15: glucose 432; bun 42; creat 2.77; k+ 4.9; na++135       Review of Systems  Constitutional: Negative for malaise/fatigue.  Respiratory: Negative for cough and shortness of breath.   Cardiovascular: Negative for chest pain, palpitations and leg swelling.  Gastrointestinal: Negative for heartburn, abdominal pain and constipation.  Musculoskeletal: Positive for myalgias.  Negative for back pain and joint pain.       Right thigh pain   Skin: Negative.   Neurological: Negative for dizziness.  Psychiatric/Behavioral: The patient is not nervous/anxious.      Physical Exam  Constitutional: No distress.  Obese   Eyes: Conjunctivae are normal.  Neck: Neck supple. No JVD present. No thyromegaly present.  Cardiovascular: Normal rate, regular rhythm and intact distal pulses.   Respiratory: Effort normal and breath sounds normal. No respiratory distress. He has no wheezes.  GI: Soft. Bowel sounds are normal. He exhibits no distension. There is no tenderness.  Musculoskeletal: He exhibits no edema.  Able to move all extremities   Lymphadenopathy:    He has no cervical adenopathy.  Neurological: He is alert.  Skin: Skin is warm and dry. He is not diaphoretic.  Lower extremities discolored   Psychiatric: He has a normal mood and affect.    ASSESSMENT/PLAN  1. Diabetes 2. ckd stage IV 3. Hyperkalemia  Will continue his current diabetes regimen  Will continue to monitor his renal status and k+ level  Will have therapy treat his right thigh muscle spasms   Time spent with patient  30  minutes >50% time spent counseling; reviewing medical record; tests; labs; and developing future plan of care    Ok Edwards NP [facility]  Contact [phone] Monday through Friday 8am- 5pm  After hours call [phone]

## 2015-04-20 LAB — BASIC METABOLIC PANEL
BUN: 52 mg/dL — AB (ref 4–21)
Creatinine: 2.9 mg/dL — AB (ref 0.6–1.3)
GLUCOSE: 260 mg/dL
Potassium: 5.1 mmol/L (ref 3.4–5.3)
SODIUM: 137 mmol/L (ref 137–147)

## 2015-04-20 LAB — HEMOGLOBIN A1C: HEMOGLOBIN A1C: 7.5

## 2015-04-28 ENCOUNTER — Non-Acute Institutional Stay (SKILLED_NURSING_FACILITY): Payer: Medicare Other | Admitting: Adult Health

## 2015-04-28 ENCOUNTER — Encounter: Payer: Self-pay | Admitting: Adult Health

## 2015-04-28 DIAGNOSIS — J111 Influenza due to unidentified influenza virus with other respiratory manifestations: Secondary | ICD-10-CM | POA: Diagnosis not present

## 2015-04-28 LAB — BASIC METABOLIC PANEL
BUN: 66 mg/dL — AB (ref 4–21)
Creatinine: 2.9 mg/dL — AB (ref 0.6–1.3)
Glucose: 223 mg/dL
POTASSIUM: 5.9 mmol/L — AB (ref 3.4–5.3)
SODIUM: 137 mmol/L (ref 137–147)

## 2015-04-28 NOTE — Progress Notes (Signed)
Patient ID: Dale Henry, male   DOB: 01/05/1946, 70 y.o.   MRN: [id number]   Facility:  [facility]       Allergies  Allergen Reactions  . Codeine Itching  . Shellfish Allergy Nausea Only    Chief Complaint  Patient presents with  . Acute Visit    Flu    HPI:  He has a cough and congestion present with body aches present. There are no reports of fever present. There have been cases of influenza in this facility.    Past Medical History  Diagnosis Date  . Morbid obesity ([place])   . Diabetes mellitus without complication ([place])     History reviewed. No pertinent past surgical history.  VITAL SIGNS BP 144/74 mmHg  Pulse 76  Temp(Src) 97.8 F (36.6 C) (Oral)  Resp 18  Ht 5\' 10"  (1.778 m)  Wt 381 lb (172.82 kg)  BMI 54.67 kg/m2  SpO2 93%  Patient's Medications  New Prescriptions   No medications on file  Previous Medications   ATORVASTATIN (LIPITOR) 20 MG TABLET    Take 20 mg by mouth at bedtime. Reported on 04/18/2015   CETIRIZINE (ZYRTEC) 10 MG TABLET    Take 10 mg by mouth daily.   DOCUSATE SODIUM (COLACE) 100 MG CAPSULE    Take 100 mg by mouth 2 (two) times daily.   FERROUS SULFATE 325 (65 FE) MG EC TABLET    Take 325 mg by mouth daily.   GABAPENTIN (NEURONTIN) 100 MG CAPSULE    Take 100 mg by mouth 2 (two) times daily.   HYDROCODONE-ACETAMINOPHEN (NORCO) 10-325 MG TABLET    Take 1 tablet by mouth every 4 (four) hours as needed for moderate pain.   HYDROXYZINE (ATARAX/VISTARIL) 25 MG TABLET    Take 25 mg by mouth 3 (three) times daily.    INSULIN ASPART (NOVOLOG) 100 UNIT/ML INJECTION    Inject 22 Units into the skin 3 (three) times daily before meals.   INSULIN REGULAR (NOVOLIN R,HUMULIN R) 100 UNITS/ML INJECTION    Inject 30 Units into the skin 2 (two) times daily before a meal. Per sliding scale   NYSTATIN (MYCOSTATIN/NYSTOP) 100000 UNIT/GM POWD    As directed per package insert 3 times daily to affected area   OXYCODONE (OXYCONTIN) 15 MG 12 HR TABLET    Take  1 tablet (15 mg total) by mouth every 12 (twelve) hours.   POLYETHYLENE GLYCOL (MIRALAX / GLYCOLAX) PACKET    Take 17 g by mouth daily.   PROBIOTIC CAPS    Take 1 capsule by mouth daily.   SENNA (SENOKOT) 8.6 MG TABS TABLET    Take 1 tablet (8.6 mg total) by mouth at bedtime as needed for mild constipation.   TAMSULOSIN (FLOMAX) 0.4 MG CAPS CAPSULE    Take 0.4 mg by mouth daily.   ZINC SULFATE 220 MG CAPSULE    Take 220 mg by mouth daily. X 45 days  Modified Medications   No medications on file  Discontinued Medications     SIGNIFICANT DIAGNOSTIC EXAMS  04-04-15: Mild cardiomegaly and pulmonary vascular congestion. Retrocardiac consolidation and small LEFT pleural effusion. RIGHT lung base atelectasis  04-04-15: ct of head and cervical spine: No acute intracranial abnormalities. Chronic atrophy and small vessel ischemic changes.  Nonspecific straightening of usual cervical lordosis. Diffuse degenerative changes in the cervical spine. No acute displaced fractures identified.  04-04-15: abdominal ultrasound: 1. Limited exam due to patient body habitus. The spleen is not identified. 2. No acute  findings in the abdomen identified   LABS REVIEWED:   04-04-15: wbc 6.0; hgb 8.5; hct 28.1; mcv 89.2; plt 189; glucose 299; bun 77; creat 5.39; k+ >7.5; na++139; ast 13; alt 14; total bili 0.2; albumin 2.6 04-06-15: glucose 159; bun 56; creat 4.25; k+ 5.3; na++144 04-08-15: glucose 264; bun 41; creat 2.94; k+ 5.2; na++140 04-16-15: glucose 432; bun 42; creat 2.77; k+ 4.9; na++135   04-20-15: glucose 260; bun 52; creat 2.94; k+ 5.1; na++ 137; pre-albumin 17; hgb a1c 7.5  04-21-15: wbc 6.2; hgb 9.9; hct 32.1; mcv 89.9; plt 151 04-23-15: urine culture: 25,000 colonies proteus mirabilis      Review of Systems  Constitutional: Positive for malaise/fatigue.  HENT: Positive for congestion.   Respiratory: Positive for cough. Negative for shortness of breath.   Cardiovascular: Negative for chest pain,  palpitations and leg swelling.  Gastrointestinal: Negative for heartburn, abdominal pain and constipation.  Musculoskeletal: Positive for myalgias. Negative for back pain and joint pain.  Skin: Negative.   Neurological: Negative for dizziness.  Psychiatric/Behavioral: The patient is not nervous/anxious.       Physical Exam  Constitutional: No distress.  Obese   Eyes: Conjunctivae are normal.  Neck: Neck supple. No JVD present. No thyromegaly present.  Cardiovascular: Normal rate, regular rhythm and intact distal pulses.   Respiratory: . No respiratory distress. He has no wheezes. has rhonchi present GI: Soft. Bowel sounds are normal. He exhibits no distension. There is no tenderness.  Musculoskeletal: He exhibits no edema.  Able to move all extremities   Lymphadenopathy:    He has no cervical adenopathy.  Neurological: He is alert.  Skin: Skin is warm and dry. He is not diaphoretic.  Lower extremities discolored   Psychiatric: He has a normal mood and affect.    ASSESSMENT/PLAN  1. Influenza: this facility has had + influenza cases; will begin tamiflu 75 mg twice daily for 5 days and will get a rapid flu swab.      Ok Edwards NP [facility]  Contact [phone] Monday through Friday 8am- 5pm  After hours call [phone]

## 2015-04-29 ENCOUNTER — Non-Acute Institutional Stay (SKILLED_NURSING_FACILITY): Payer: Medicare Other | Admitting: Adult Health

## 2015-04-29 ENCOUNTER — Encounter: Payer: Self-pay | Admitting: Adult Health

## 2015-04-29 DIAGNOSIS — N184 Chronic kidney disease, stage 4 (severe): Secondary | ICD-10-CM | POA: Diagnosis not present

## 2015-04-29 DIAGNOSIS — E1165 Type 2 diabetes mellitus with hyperglycemia: Secondary | ICD-10-CM

## 2015-04-29 DIAGNOSIS — Z794 Long term (current) use of insulin: Secondary | ICD-10-CM

## 2015-04-29 DIAGNOSIS — E1122 Type 2 diabetes mellitus with diabetic chronic kidney disease: Secondary | ICD-10-CM | POA: Diagnosis not present

## 2015-04-29 DIAGNOSIS — G8929 Other chronic pain: Secondary | ICD-10-CM

## 2015-04-29 DIAGNOSIS — IMO0002 Reserved for concepts with insufficient information to code with codable children: Secondary | ICD-10-CM

## 2015-04-29 NOTE — Progress Notes (Signed)
Patient ID: MAC DEFORE, male   DOB: 01-04-1946, 70 y.o.   MRN: [id number]   Facility: [facility]       Allergies  Allergen Reactions  . Codeine Itching  . Shellfish Allergy Nausea Only    Chief Complaint  Patient presents with  . Acute Visit    Lab Follow up    HPI:  His insulin orders needed to be better clarified this will be done. His wife is concerned about him being too sleepy in the AM and wants his oxycontin to be given only at night. His k+ is slightly elevated at 5.9 and will need to be monitored closely.    Past Medical History  Diagnosis Date  . Morbid obesity ([place])   . Diabetes mellitus without complication ([place])     History reviewed. No pertinent past surgical history.  VITAL SIGNS BP 144/74 mmHg  Pulse 76  Temp(Src) 97.8 F (36.6 C) (Oral)  Resp 18  Ht 5\' 10"  (1.778 m)  Wt 381 lb (172.82 kg)  BMI 54.67 kg/m2  SpO2 93%  Patient's Medications  New Prescriptions   No medications on file  Previous Medications   ATORVASTATIN (LIPITOR) 20 MG TABLET    Take 20 mg by mouth at bedtime. Reported on 04/18/2015   CETIRIZINE (ZYRTEC) 10 MG TABLET    Take 10 mg by mouth daily.   DOCUSATE SODIUM (COLACE) 100 MG CAPSULE    Take 100 mg by mouth 2 (two) times daily.   FERROUS SULFATE 325 (65 FE) MG EC TABLET    Take 325 mg by mouth daily.   GABAPENTIN (NEURONTIN) 100 MG CAPSULE    Take 100 mg by mouth 2 (two) times daily.   HYDROCODONE-ACETAMINOPHEN (NORCO) 10-325 MG TABLET    Take 1 tablet by mouth every 4 (four) hours as needed for moderate pain.   HYDROXYZINE (ATARAX/VISTARIL) 25 MG TABLET    Take 25 mg by mouth 3 (three) times daily.    INSULIN ASPART (NOVOLOG) 100 UNIT/ML INJECTION    Inject 22 Units into the skin 3 (three) times daily before meals.   INSULIN REGULAR (NOVOLIN R,HUMULIN R) 100 UNITS/ML INJECTION    Inject 30 Units into the skin 2 (two) times daily before a meal. Per sliding scale   NYSTATIN (MYCOSTATIN/NYSTOP) 100000 UNIT/GM POWD    As  directed per package insert 3 times daily to affected area   OXYCODONE (OXYCONTIN) 15 MG 12 HR TABLET    Take 1 tablet (15 mg total) by mouth every 12 (twelve) hours.   POLYETHYLENE GLYCOL (MIRALAX / GLYCOLAX) PACKET    Take 17 g by mouth daily.   PROBIOTIC CAPS    Take 1 capsule by mouth daily.   SENNA (SENOKOT) 8.6 MG TABS TABLET    Take 1 tablet (8.6 mg total) by mouth at bedtime as needed for mild constipation.   TAMSULOSIN (FLOMAX) 0.4 MG CAPS CAPSULE    Take 0.4 mg by mouth daily.   ZINC SULFATE 220 MG CAPSULE    Take 220 mg by mouth daily. X 45 days  Modified Medications   No medications on file  Discontinued Medications   No medications on file     SIGNIFICANT DIAGNOSTIC EXAMS  04-04-15: Mild cardiomegaly and pulmonary vascular congestion. Retrocardiac consolidation and small LEFT pleural effusion. RIGHT lung base atelectasis  04-04-15: ct of head and cervical spine: No acute intracranial abnormalities. Chronic atrophy and small vessel ischemic changes.  Nonspecific straightening of usual cervical lordosis. Diffuse degenerative changes  in the cervical spine. No acute displaced fractures identified.  04-04-15: abdominal ultrasound: 1. Limited exam due to patient body habitus. The spleen is not identified. 2. No acute findings in the abdomen identified   LABS REVIEWED:   04-04-15: wbc 6.0; hgb 8.5; hct 28.1; mcv 89.2; plt 189; glucose 299; bun 77; creat 5.39; k+ >7.5; na++139; ast 13; alt 14; total bili 0.2; albumin 2.6 04-06-15: glucose 159; bun 56; creat 4.25; k+ 5.3; na++144 04-08-15: glucose 264; bun 41; creat 2.94; k+ 5.2; na++140 04-16-15: glucose 432; bun 42; creat 2.77; k+ 4.9; na++135   04-28-15: glucose 223; bun 66; creat 2.81; k+ 5.9; na++137      Review of Systems  Constitutional: Negative for malaise/fatigue.  Respiratory: Negative for cough and shortness of breath.   Cardiovascular: Negative for chest pain, palpitations and leg swelling.  Gastrointestinal:  Negative for heartburn, abdominal pain and constipation.  Musculoskeletal: Positive for myalgias. Negative for back pain and joint pain.       Right thigh pain   Skin: Negative.   Neurological: Negative for dizziness.  Psychiatric/Behavioral: The patient is not nervous/anxious.      Physical Exam  Constitutional: No distress.  Obese   Eyes: Conjunctivae are normal.  Neck: Neck supple. No JVD present. No thyromegaly present.  Cardiovascular: Normal rate, regular rhythm and intact distal pulses.   Respiratory: Effort normal and breath sounds normal. No respiratory distress. He has no wheezes.  GI: Soft. Bowel sounds are normal. He exhibits no distension. There is no tenderness.  Musculoskeletal: He exhibits no edema.  Able to move all extremities   Lymphadenopathy:    He has no cervical adenopathy.  Neurological: He is alert.  Skin: Skin is warm and dry. He is not diaphoretic.  Lower extremities discolored   Psychiatric: He has a normal mood and affect.    ASSESSMENT/PLAN  1. Stage IV kidney disease 2. Chronic pain  3. Diabetes  Will check bmp on 05-02-15 Will change the oxycontin to 15 mg nightly  Will change the insulin to NPH 30 units twice daily Will begin humulin R 22 units with meals Will check cbg four times daily      Time spent with patient 45   minutes >50% time spent counseling; reviewing medical record; tests; labs; and developing future plan of care      Ok Edwards NP [facility]  Contact [phone] Monday through Friday 8am- 5pm  After hours call [phone]

## 2015-05-05 ENCOUNTER — Non-Acute Institutional Stay (SKILLED_NURSING_FACILITY): Payer: Medicare Other | Admitting: Adult Health

## 2015-05-05 ENCOUNTER — Encounter: Payer: Self-pay | Admitting: Adult Health

## 2015-05-05 DIAGNOSIS — G8929 Other chronic pain: Secondary | ICD-10-CM | POA: Diagnosis not present

## 2015-05-05 DIAGNOSIS — N184 Chronic kidney disease, stage 4 (severe): Secondary | ICD-10-CM | POA: Diagnosis not present

## 2015-05-05 DIAGNOSIS — E875 Hyperkalemia: Secondary | ICD-10-CM

## 2015-05-05 NOTE — Progress Notes (Signed)
Patient ID: Dale Henry, male   DOB: 1945/05/11, 70 y.o.   MRN: [id number]    Facility: [facility]       Allergies  Allergen Reactions  . Codeine Itching  . Shellfish Allergy Nausea Only    Chief Complaint  Patient presents with  . Acute Visit    HPI:  His wife is concerned about his being too sleepy during the day time from the use of oxycontin at night. He took ultram at home. We discussed his renal function with his k+ levels and will need to increase his kayexalate.    Past Medical History  Diagnosis Date  . Morbid obesity ([place])   . Diabetes mellitus without complication ([place])     History reviewed. No pertinent past surgical history.  VITAL SIGNS BP 154/78 mmHg  Pulse 79  Temp(Src) 96.7 F (35.9 C) (Oral)  Resp 16  Ht 5\' 10"  (1.778 m)  Wt 381 lb (172.82 kg)  BMI 54.67 kg/m2  SpO2 93%  Patient's Medications  New Prescriptions   No medications on file  Previous Medications   ATORVASTATIN (LIPITOR) 20 MG TABLET    Take 20 mg by mouth at bedtime. Reported on 04/18/2015   CETIRIZINE (ZYRTEC) 10 MG TABLET    Take 10 mg by mouth daily.   DOCUSATE SODIUM (COLACE) 100 MG CAPSULE    Take 100 mg by mouth 2 (two) times daily.   FERROUS SULFATE 325 (65 FE) MG EC TABLET    Take 325 mg by mouth daily.   GABAPENTIN (NEURONTIN) 100 MG CAPSULE    Take 100 mg by mouth 2 (two) times daily.   HYDROCODONE-ACETAMINOPHEN (NORCO) 10-325 MG TABLET    Take 1 tablet by mouth every 4 (four) hours as needed for moderate pain.   HYDROXYZINE (ATARAX/VISTARIL) 25 MG TABLET    Take 25 mg by mouth 3 (three) times daily.    INSULIN ASPART (NOVOLOG) 100 UNIT/ML INJECTION    Inject 22 Units into the skin 3 (three) times daily before meals.   INSULIN REGULAR (NOVOLIN R,HUMULIN R) 100 UNITS/ML INJECTION    Inject 30 Units into the skin 2 (two) times daily before a meal. Per sliding scale   NYSTATIN (MYCOSTATIN/NYSTOP) 100000 UNIT/GM POWD    As directed per package insert 3 times daily to  affected area   OXYCODONE (OXYCONTIN) 15 MG 12 HR TABLET    Take 15 mg nightly    POLYETHYLENE GLYCOL (MIRALAX / GLYCOLAX) PACKET    Take 17 g by mouth daily.   PROBIOTIC CAPS    Take 1 capsule by mouth daily.   SENNA (SENOKOT) 8.6 MG TABS TABLET    Take 1 tablet (8.6 mg total) by mouth at bedtime as needed for mild constipation.   TAMSULOSIN (FLOMAX) 0.4 MG CAPS CAPSULE    Take 0.4 mg by mouth daily.   VITAMIN C (ASCORBIC ACID) 500 MG TABLET    Take 500 mg by mouth 2 (two) times daily.   ZINC SULFATE 220 MG CAPSULE    Take 220 mg by mouth daily. X 45 days  Modified Medications   No medications on file  Discontinued Medications   No medications on file     SIGNIFICANT DIAGNOSTIC EXAMS  04-04-15: Mild cardiomegaly and pulmonary vascular congestion. Retrocardiac consolidation and small LEFT pleural effusion. RIGHT lung base atelectasis  04-04-15: ct of head and cervical spine: No acute intracranial abnormalities. Chronic atrophy and small vessel ischemic changes.  Nonspecific straightening of usual cervical lordosis. Diffuse  degenerative changes in the cervical spine. No acute displaced fractures identified.  04-04-15: abdominal ultrasound: 1. Limited exam due to patient body habitus. The spleen is not identified. 2. No acute findings in the abdomen identified   LABS REVIEWED:   04-04-15: wbc 6.0; hgb 8.5; hct 28.1; mcv 89.2; plt 189; glucose 299; bun 77; creat 5.39; k+ >7.5; na++139; ast 13; alt 14; total bili 0.2; albumin 2.6 04-06-15: glucose 159; bun 56; creat 4.25; k+ 5.3; na++144 04-08-15: glucose 264; bun 41; creat 2.94; k+ 5.2; na++140 04-16-15: glucose 432; bun 42; creat 2.77; k+ 4.9; na++135   04-28-15: glucose 223; bun 66; creat 2.81; k+ 5.9; na++137  05-04-15: glucose 167; bun 38; creat 2.16; k+ 5.8; na++140     Review of Systems  Constitutional: Negative for malaise/fatigue.  Respiratory: Negative for cough and shortness of breath.   Cardiovascular: Negative for chest  pain, palpitations and leg swelling.  Gastrointestinal: Negative for heartburn, abdominal pain and constipation.  Musculoskeletal: Positive for myalgias. Negative for back pain and joint pain.       Right thigh pain   Skin: Negative.   Neurological: Negative for dizziness.  Psychiatric/Behavioral: The patient is not nervous/anxious.      Physical Exam  Constitutional: No distress.  Obese   Eyes: Conjunctivae are normal.  Neck: Neck supple. No JVD present. No thyromegaly present.  Cardiovascular: Normal rate, regular rhythm and intact distal pulses.   Respiratory: Effort normal and breath sounds normal. No respiratory distress. He has no wheezes.  GI: Soft. Bowel sounds are normal. He exhibits no distension. There is no tenderness.  Musculoskeletal: He exhibits no edema.  Able to move all extremities   Lymphadenopathy:    He has no cervical adenopathy.  Neurological: He is alert.  Skin: Skin is warm and dry. He is not diaphoretic.  Lower extremities discolored   Psychiatric: He has a normal mood and affect.    ASSESSMENT/PLAN  1. ckd stage IV 2. Hyperkalemia 3. Chronic pain   Will increase his kayexalate to 90 gm every Monday and will continue weekly bmp monitoring Will stop the oxycontin Will begin ultram 100 mg every 6 hours as needed    Time spent with patient  30  minutes >50% time spent counseling; reviewing medical record; tests; labs; and developing future plan of care

## 2015-05-08 ENCOUNTER — Encounter: Payer: Self-pay | Admitting: Adult Health

## 2015-05-08 DIAGNOSIS — G8929 Other chronic pain: Secondary | ICD-10-CM | POA: Insufficient documentation

## 2015-05-10 ENCOUNTER — Encounter: Payer: Self-pay | Admitting: Internal Medicine

## 2015-05-10 ENCOUNTER — Non-Acute Institutional Stay (SKILLED_NURSING_FACILITY): Payer: Medicare Other | Admitting: Internal Medicine

## 2015-05-10 DIAGNOSIS — N184 Chronic kidney disease, stage 4 (severe): Secondary | ICD-10-CM | POA: Diagnosis not present

## 2015-05-10 DIAGNOSIS — Z87448 Personal history of other diseases of urinary system: Secondary | ICD-10-CM | POA: Diagnosis not present

## 2015-05-10 DIAGNOSIS — G8929 Other chronic pain: Secondary | ICD-10-CM | POA: Diagnosis not present

## 2015-05-10 DIAGNOSIS — I872 Venous insufficiency (chronic) (peripheral): Secondary | ICD-10-CM

## 2015-05-10 DIAGNOSIS — I8312 Varicose veins of left lower extremity with inflammation: Secondary | ICD-10-CM

## 2015-05-10 DIAGNOSIS — I8311 Varicose veins of right lower extremity with inflammation: Secondary | ICD-10-CM

## 2015-05-10 DIAGNOSIS — I10 Essential (primary) hypertension: Secondary | ICD-10-CM

## 2015-05-10 DIAGNOSIS — Z8603 Personal history of neoplasm of uncertain behavior: Secondary | ICD-10-CM

## 2015-05-10 NOTE — Progress Notes (Signed)
Patient ID: Dale Henry, male   DOB: 1945/08/30, 70 y.o.   MRN: [id number]    DATE: 05/10/15  Location:  [facility]: [phone])   Extended Emergency Contact Information Primary Emergency Contact: [name], [name] Address: [address]          [place] [address] [place] Phone: [phone] Mobile Phone: [phone] Relation: Spouse  Advanced Directive information Does patient have an advance directive?: Yes, Type of Advance Directive: Out of facility DNR (pink MOST or yellow form), Does patient want to make changes to advanced directive?: No - Patient declined  Chief Complaint  Patient presents with  . Acute Visit    HPI:  70 yo male seen today with his wife present to discuss medications and coordination of care. Wife c/a pt taking oxycontin/oxycodone and does  Not want him to get addicted to med. Pain med changed to tramadol last week by NP. She would like bladder checked as he has not seen urology in some time. Wife ok with palliative care following pt. He has multiple co-morbidities including morbid obesity. Wife would like to bring his powered chair to facility. No other concerns. Cr 2.9. A1c 7.5%  Past Medical History  Diagnosis Date  . Morbid obesity ([place])   . Diabetes mellitus without complication ([place])   . 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 Acquired lymphedema 02/25/2015  . 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 No pertinent past surgical history.  Patient Care Team: Provider Not In System as PCP - General  Social History   Social History  . Marital Status: Married    Spouse Name: N/A  . Number of Children: N/A  . Years of Education: N/A   Occupational History  . Not on file.   Social History Main Topics  . Smoking status: Never Smoker   . Smokeless tobacco: Never Used  . Alcohol Use: No  . Drug Use: No  . Sexual Activity: No   Other Topics Concern  . Not on  file   Social History Narrative     reports that he has never smoked. He has never used smokeless tobacco. He reports that he does not drink alcohol or use illicit drugs.  Immunization History  Administered Date(s) Administered  . PPD Test 04/11/2015    Allergies  Allergen Reactions  . Codeine Itching  . Shellfish Allergy Nausea Only    Medications: Patient's Medications  New Prescriptions   No medications on file  Previous Medications   ATORVASTATIN (LIPITOR) 20 MG TABLET    Take 20 mg by mouth daily.   CETIRIZINE (ZYRTEC) 10 MG TABLET    Take 10 mg by mouth daily.   DOCUSATE SODIUM (COLACE) 100 MG CAPSULE    Take 100 mg by mouth 2 (two) times daily.   FEEDING SUPPLEMENT (BOOST HIGH PROTEIN) LIQD    Take 1 Container by mouth 2 (two) times daily between meals.   FERROUS SULFATE 325 (65 FE) MG TABLET    Take 325 mg by mouth daily with breakfast.   GABAPENTIN (NEURONTIN) 100 MG CAPSULE    Take 100 mg by mouth 2 (two) times daily.   HYDROXYZINE (ATARAX/VISTARIL) 25 MG TABLET    Take 25 mg by mouth 2 (two) times daily.   INSULIN ASPART (NOVOLOG) 100 UNIT/ML INJECTION    Inject 22 Units into the skin 3 (three) times daily before meals.   INSULIN REGULAR (NOVOLIN  R,HUMULIN R) 100 UNITS/ML INJECTION    Inject 30 Units into the skin 2 (two) times daily before a meal. Per sliding scale   NYSTATIN (MYCOSTATIN/NYSTOP) 100000 UNIT/GM POWD    As directed per package insert 3 times daily to affected area   POLYETHYLENE GLYCOL (MIRALAX / GLYCOLAX) PACKET    Take 17 g by mouth daily.   PROBIOTIC CAPS    Take 250 mg tablet by mouth daily   SODIUM POLYSTYRENE (KAYEXALATE) 15 GM/60ML SUSPENSION    Take 90 g by mouth once. Once every Monday   TAMSULOSIN (FLOMAX) 0.4 MG CAPS CAPSULE    Take 0.4 mg by mouth daily.   VITAMIN C (ASCORBIC ACID) 500 MG TABLET    Take 500 mg by mouth 2 (two) times daily.   ZINC SULFATE 220 MG CAPSULE    Take 220 mg by mouth daily. X 45 days  Modified Medications   No  medications on file  Discontinued Medications   ATORVASTATIN (LIPITOR) 20 MG TABLET    Take 20 mg by mouth at bedtime. Reported on 04/18/2015   CETIRIZINE (ZYRTEC) 10 MG TABLET    Take 10 mg by mouth daily.   DOCUSATE SODIUM (COLACE) 100 MG CAPSULE    Take 100 mg by mouth 2 (two) times daily.   FERROUS SULFATE 325 (65 FE) MG EC TABLET    Take 325 mg by mouth daily.   GABAPENTIN (NEURONTIN) 100 MG CAPSULE    Take 100 mg by mouth 2 (two) times daily.   HYDROCODONE-ACETAMINOPHEN (NORCO) 10-325 MG TABLET    Take 1 tablet by mouth every 4 (four) hours as needed for moderate pain.   HYDROXYZINE (ATARAX/VISTARIL) 25 MG TABLET    Take 25 mg by mouth 3 (three) times daily.    OXYCODONE (OXYCONTIN) 15 MG 12 HR TABLET    Take 1 tablet (15 mg total) by mouth every 12 (twelve) hours.   POLYETHYLENE GLYCOL (MIRALAX / GLYCOLAX) PACKET    Take 17 g by mouth daily.   SENNA (SENOKOT) 8.6 MG TABS TABLET    Take 1 tablet (8.6 mg total) by mouth at bedtime as needed for mild constipation.    Review of Systems  Cardiovascular: Positive for leg swelling.  Musculoskeletal: Positive for arthralgias and gait problem.  Skin: Positive for wound.  All other systems reviewed and are negative.   Filed Vitals:   05/10/15 1437  BP: 166/70  (154/77)  Pulse: 94  (94)  Temp: 100.1 F (37.8 C)  (99.69F)  TempSrc: Oral  Resp: 18  Height: 5\' 10"  (1.778 m)  Weight: 380 lb 9.6 oz (172.639 kg)  (377 lb)  SpO2: 93%  (95%)   Body mass index is 54.61 kg/(m^2).  Physical Exam  Constitutional: He appears well-developed and well-nourished. No distress.  Lying in bed in NAD  Cardiovascular:  B/l LE chronic venous stasis changes. No increased redness or new d/c  Musculoskeletal: He exhibits edema and tenderness.  Neurological: He is alert.  Skin: Skin is warm and dry. Rash (b/l chronic venous stasis changes) noted.  Psychiatric: He has a normal mood and affect. His behavior is normal.     Labs reviewed: Nursing Home on  04/28/2015  Component Date Value Ref Range Status  . Glucose 04/20/2015 260   Final  . BUN 04/20/2015 52* 4 - 21 mg/dL Final  . Creatinine 04/20/2015 2.9* 0.6 - 1.3 mg/dL Final  . Potassium 04/20/2015 5.1  3.4 - 5.3 mmol/L Final  . Sodium 04/20/2015 137  137 - 147 mmol/L Final  . Hemoglobin A1C 04/20/2015 7.5   Final  . Glucose 04/28/2015 223   Final  . BUN 04/28/2015 66* 4 - 21 mg/dL Final  . Creatinine 04/28/2015 2.9* 0.6 - 1.3 mg/dL Final  . Potassium 04/28/2015 5.9* 3.4 - 5.3 mmol/L Final  . Sodium 04/28/2015 137  137 - 147 mmol/L Final  Admission on 04/04/2015, Discharged on 04/11/2015  No results displayed because visit has over 200 results.      No results found.   Assessment/Plan   ICD-9-CM ICD-10-CM   1. Venous stasis dermatitis of both lower extremities 454.1 I83.11     I83.12   2. Chronic pain 338.29 G89.29   3. Essential (primary) hypertension 401.9 I10   4. History of neoplasm of bladder V13.09 Z87.448   5. Morbid (severe) obesity due to excess calories ([place]) 278.01 E66.01   6. Chronic kidney disease, stage IV (severe) ([facility]) 585.4 N18.4     Refer to urology Dr Felipa Eth to f/u bladder CA  Cont current meds as ordered. He is now taking tramadol for pain  Will have facility treatment   nurse to check b/l LE dermatitis at least weekly to monitor for any changes  Fluid restrict 1800cc per day  May bring powered w/c from home  PT/OT as ordered  Palliative care following  Daily weights  Will follow  Lossie Kalp S. Perlie Gold  [facility] [address] [place], [place] [address] [phone] Cell (Monday-Friday 8 AM - 5 PM) [phone] After 5 PM and follow prompts

## 2015-05-18 LAB — BASIC METABOLIC PANEL
BUN: 38 mg/dL — AB (ref 4–21)
CREATININE: 1.9 mg/dL — AB (ref 0.6–1.3)
Glucose: 91 mg/dL
Potassium: 5.1 mmol/L (ref 3.4–5.3)
SODIUM: 144 mmol/L (ref 137–147)

## 2015-05-19 ENCOUNTER — Non-Acute Institutional Stay (SKILLED_NURSING_FACILITY): Payer: Medicare Other | Admitting: Adult Health

## 2015-05-19 DIAGNOSIS — G8929 Other chronic pain: Secondary | ICD-10-CM

## 2015-05-19 DIAGNOSIS — E1122 Type 2 diabetes mellitus with diabetic chronic kidney disease: Secondary | ICD-10-CM

## 2015-05-19 DIAGNOSIS — E1165 Type 2 diabetes mellitus with hyperglycemia: Secondary | ICD-10-CM

## 2015-05-19 DIAGNOSIS — Z794 Long term (current) use of insulin: Secondary | ICD-10-CM

## 2015-05-19 DIAGNOSIS — E785 Hyperlipidemia, unspecified: Secondary | ICD-10-CM

## 2015-05-19 DIAGNOSIS — E875 Hyperkalemia: Secondary | ICD-10-CM

## 2015-05-19 DIAGNOSIS — N184 Chronic kidney disease, stage 4 (severe): Secondary | ICD-10-CM | POA: Diagnosis not present

## 2015-05-19 DIAGNOSIS — IMO0002 Reserved for concepts with insufficient information to code with codable children: Secondary | ICD-10-CM

## 2015-05-26 ENCOUNTER — Non-Acute Institutional Stay (SKILLED_NURSING_FACILITY): Payer: Medicare Other | Admitting: Adult Health

## 2015-05-26 DIAGNOSIS — G2581 Restless legs syndrome: Secondary | ICD-10-CM | POA: Diagnosis not present

## 2015-05-26 DIAGNOSIS — D638 Anemia in other chronic diseases classified elsewhere: Secondary | ICD-10-CM

## 2015-05-27 ENCOUNTER — Encounter: Payer: Self-pay | Admitting: Adult Health

## 2015-05-27 NOTE — Progress Notes (Signed)
Patient ID: Dale Henry, male   DOB: [birth year], 70 y.o.   MRN: [id number]   Facility: [facility]       Allergies  Allergen Reactions  . Codeine Itching  . Shellfish Allergy Nausea Only   Chief Complaint  Patient presents with  . Acute Visit    patient concerns     HPI:  Per his wife; he should be taking mirapex 0.12 5 mg nightly; and iron daily. Per my records he is taking these medications. I have informed his wife that these orders will be clarified.   Past Medical History  Diagnosis Date  . Morbid obesity ([place])   . Diabetes mellitus without complication ([place])   . 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 Acquired lymphedema 02/25/2015  . 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 No pertinent past surgical history.  VITAL SIGNS BP 134/72 mmHg  Pulse 74  Ht 5\' 10"  (1.778 m)  Wt 395 lb (179.171 kg)  BMI 56.68 kg/m2  SpO2 95%  Patient's Medications  New Prescriptions   No medications on file  Previous Medications   ATORVASTATIN (LIPITOR) 20 MG TABLET    Take 20 mg by mouth daily.   CETIRIZINE (ZYRTEC) 10 MG TABLET    Take 10 mg by mouth daily.   DOCUSATE SODIUM (COLACE) 100 MG CAPSULE    Take 100 mg by mouth 2 (two) times daily.   FEEDING SUPPLEMENT (BOOST HIGH PROTEIN) LIQD    Take 1 Container by mouth 2 (two) times daily between meals.   FERROUS SULFATE 325 (65 FE) MG TABLET    Take 325 mg by mouth daily with breakfast.   GABAPENTIN (NEURONTIN) 100 MG CAPSULE    Take 100 mg by mouth 2 (two) times daily.   HYDROCODONE-ACETAMINOPHEN (NORCO) 10-325 MG TABLET    Take 1 tablet by mouth every 4 (four) hours as needed.   HYDROXYZINE (ATARAX/VISTARIL) 25 MG TABLET    Take 25 mg by mouth 2 (two) times daily.   INSULIN ASPART (NOVOLOG) 100 UNIT/ML INJECTION    Inject 22 Units into the skin 3 (three) times daily before meals.   INSULIN REGULAR (NOVOLIN R,HUMULIN R) 100 UNITS/ML INJECTION    Inject 30 Units into the skin 2 (two) times daily before a meal.  Per sliding scale   MIRABEGRON ER (MYRBETRIQ) 25 MG TB24 TABLET    Take 25 mg by mouth daily.   NYSTATIN (MYCOSTATIN/NYSTOP) 100000 UNIT/GM POWD    As directed per package insert 3 times daily to affected area   POLYETHYLENE GLYCOL (MIRALAX / GLYCOLAX) PACKET    Take 17 g by mouth daily.   PROBIOTIC CAPS    Take 250 mg tablet by mouth daily   PROMETHAZINE (PHENERGAN) 25 MG TABLET    Take 25 mg by mouth every 6 (six) hours as needed for nausea or vomiting.   SENNA (SENOKOT) 8.6 MG TABS TABLET    Take 1 tablet by mouth daily as needed for mild constipation.   SODIUM POLYSTYRENE (KAYEXALATE) 15 GM/60ML SUSPENSION    Take 90 g by mouth once. Once every Monday   TAMSULOSIN (FLOMAX) 0.4 MG CAPS CAPSULE    Take 0.4 mg by mouth daily.   TRAMADOL (ULTRAM-ER) 100 MG 24 HR TABLET    Take 100 mg by mouth every 6 (six) hours as needed for pain.   VITAMIN C (ASCORBIC ACID) 500 MG TABLET    Take 500 mg by mouth 2 (two) times daily.   ZINC  SULFATE 220 MG CAPSULE    Take 220 mg by mouth daily. X 45 days  Modified Medications   No medications on file  Discontinued Medications   No medications on file     SIGNIFICANT DIAGNOSTIC EXAMS  04-04-15: Mild cardiomegaly and pulmonary vascular congestion. Retrocardiac consolidation and small LEFT pleural effusion. RIGHT lung base atelectasis  04-04-15: ct of head and cervical spine: No acute intracranial abnormalities. Chronic atrophy and small vessel ischemic changes.  Nonspecific straightening of usual cervical lordosis. Diffuse degenerative changes in the cervical spine. No acute displaced fractures identified.  04-04-15: abdominal ultrasound: 1. Limited exam due to patient body habitus. The spleen is not identified. 2. No acute findings in the abdomen identified   LABS REVIEWED:   04-04-15: wbc 6.0; hgb 8.5; hct 28.1; mcv 89.2; plt 189; glucose 299; bun 77; creat 5.39; k+ >7.5; na++139; ast 13; alt 14; total bili 0.2; albumin 2.6 04-06-15: glucose 159; bun  56; creat 4.25; k+ 5.3; na++144 04-08-15: glucose 264; bun 41; creat 2.94; k+ 5.2; na++140 04-16-15: glucose 432; bun 42; creat 2.77; k+ 4.9; na++135   04-28-15: glucose 223; bun 66; creat 2.81; k+ 5.9; na++137  05-04-15: glucose 167; bun 38; creat 2.16; k+ 5.8; na++140     Review of Systems  Constitutional: Negative for malaise/fatigue.  Respiratory: Negative for cough and shortness of breath.   Cardiovascular: Negative for chest pain, palpitations and leg swelling.  Gastrointestinal: Negative for heartburn, abdominal pain and constipation.  Musculoskeletal: Positive for myalgias. Negative for back pain and joint pain.       Right thigh pain   Skin: Negative.   Neurological: Negative for dizziness.  Psychiatric/Behavioral: The patient is not nervous/anxious.      Physical Exam  Constitutional: No distress.  Obese   Eyes: Conjunctivae are normal.  Neck: Neck supple. No JVD present. No thyromegaly present.  Cardiovascular: Normal rate, regular rhythm and intact distal pulses.   Respiratory: Effort normal and breath sounds normal. No respiratory distress. He has no wheezes.  GI: Soft. Bowel sounds are normal. He exhibits no distension. There is no tenderness.  Musculoskeletal: He exhibits no edema.  Able to move all extremities   Lymphadenopathy:    He has no cervical adenopathy.  Neurological: He is alert.  Skin: Skin is warm and dry. He is not diaphoretic.  Lower extremities discolored   Psychiatric: He has a normal mood and affect.    ASSESSMENT/PLAN  1. RLS 2. Anemia Will continue mirapex 0.125 mg nightly  Will continue iron daily      Ok Edwards NP [facility]  Contact [phone] Monday through Friday 8am- 5pm  After hours call [phone]

## 2015-06-01 ENCOUNTER — Encounter: Payer: Self-pay | Admitting: Adult Health

## 2015-06-01 NOTE — Progress Notes (Signed)
Patient ID: Dale Henry, male   DOB: 05-22-1945, 70 y.o.   MRN: [id number]   Facility: [facility]       Allergies  Allergen Reactions  . Codeine Itching  . Shellfish Allergy Nausea Only    Chief Complaint  Patient presents with  . Medical Management of Chronic Issues    Follow up    HPI:  He is a resident of this facility being seen for the management of his chronic illnesses. Overall his status is stable. His renal function is remaining stable at this time. He is not voicing any complaints or concerns at this time. There are no nursing concerns at this time.    Past Medical History  Diagnosis Date  . Morbid obesity ([place])   . Diabetes mellitus without complication ([place])   . 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 Acquired lymphedema 02/25/2015  . 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 No pertinent past surgical history.  VITAL SIGNS There were no vitals taken for this visit.  Patient's Medications  New Prescriptions   No medications on file  Previous Medications   ATORVASTATIN (LIPITOR) 20 MG TABLET    Take 20 mg by mouth daily.   CETIRIZINE (ZYRTEC) 10 MG TABLET    Take 10 mg by mouth daily.   DOCUSATE SODIUM (COLACE) 100 MG CAPSULE    Take 100 mg by mouth 2 (two) times daily.   FEEDING SUPPLEMENT (BOOST HIGH PROTEIN) LIQD    Take 1 Container by mouth 2 (two) times daily between meals.   FERROUS SULFATE 325 (65 FE) MG TABLET    Take 325 mg by mouth daily with breakfast.   GABAPENTIN (NEURONTIN) 100 MG CAPSULE    Take 100 mg by mouth 2 (two) times daily.   HYDROCODONE-ACETAMINOPHEN (NORCO) 10-325 MG TABLET    Take 1 tablet by mouth every 4 (four) hours as needed.   HYDROXYZINE (ATARAX/VISTARIL) 25 MG TABLET    Take 25 mg by mouth 2 (two) times daily.   INSULIN ASPART (NOVOLOG) 100 UNIT/ML INJECTION    Inject 22 Units into the skin 3 (three) times daily before meals.   INSULIN REGULAR (NOVOLIN R,HUMULIN R) 100 UNITS/ML INJECTION    Inject 30 Units  into the skin 2 (two) times daily before a meal. Per sliding scale   MAGNESIUM HYDROXIDE (MILK OF MAGNESIA) 400 MG/5ML SUSPENSION    Take 30 mLs by mouth daily as needed for mild constipation.   MIRABEGRON ER (MYRBETRIQ) 25 MG TB24 TABLET    Take 25 mg by mouth daily.   NYSTATIN (MYCOSTATIN/NYSTOP) 100000 UNIT/GM POWD    As directed per package insert 3 times daily to affected area   POLYETHYLENE GLYCOL (MIRALAX / GLYCOLAX) PACKET    Take 17 g by mouth daily.   PROBIOTIC CAPS    Take 250 mg tablet by mouth daily   SENNA (SENOKOT) 8.6 MG TABS TABLET    Take 1 tablet by mouth daily as needed for mild constipation.   SODIUM POLYSTYRENE (KAYEXALATE) 15 GM/60ML SUSPENSION    Take 90 g by mouth once. Once every Monday   TAMSULOSIN (FLOMAX) 0.4 MG CAPS CAPSULE    Take 0.4 mg by mouth daily.   TRAMADOL (ULTRAM-ER) 100 MG 24 HR TABLET    Take 100 mg by mouth every 6 (six) hours as needed for pain.   VITAMIN C (ASCORBIC ACID) 500 MG TABLET    Take 500 mg by mouth 2 (two) times daily.  Modified  Medications   No medications on file  Discontinued Medications   PROMETHAZINE (PHENERGAN) 25 MG TABLET    Take 25 mg by mouth every 6 (six) hours as needed for nausea or vomiting. Reported on 06/01/2015   ZINC SULFATE 220 MG CAPSULE    Take 220 mg by mouth daily. Reported on 06/01/2015     SIGNIFICANT DIAGNOSTIC EXAMS  04-04-15: Mild cardiomegaly and pulmonary vascular congestion. Retrocardiac consolidation and small LEFT pleural effusion. RIGHT lung base atelectasis  04-04-15: ct of head and cervical spine: No acute intracranial abnormalities. Chronic atrophy and small vessel ischemic changes.  Nonspecific straightening of usual cervical lordosis. Diffuse degenerative changes in the cervical spine. No acute displaced fractures identified.  04-04-15: abdominal ultrasound: 1. Limited exam due to patient body habitus. The spleen is not identified. 2. No acute findings in the abdomen identified   LABS REVIEWED:    04-04-15: wbc 6.0; hgb 8.5; hct 28.1; mcv 89.2; plt 189; glucose 299; bun 77; creat 5.39; k+ >7.5; na++139; ast 13; alt 14; total bili 0.2; albumin 2.6 04-06-15: glucose 159; bun 56; creat 4.25; k+ 5.3; na++144 04-08-15: glucose 264; bun 41; creat 2.94; k+ 5.2; na++140 04-16-15: glucose 432; bun 42; creat 2.77; k+ 4.9; na++135   04-28-15: glucose 223; bun 66; creat 2.81; k+ 5.9; na++137  05-04-15: glucose 167; bun 38; creat 2.16; k+ 5.8; na++140     Review of Systems  Constitutional: Negative for malaise/fatigue.  Respiratory: Negative for cough and shortness of breath.   Cardiovascular: Negative for chest pain, palpitations and leg swelling.  Gastrointestinal: Negative for heartburn, abdominal pain and constipation.  Musculoskeletal: . Negative for back pain and joint pain.   Skin: Negative.   Neurological: Negative for dizziness.  Psychiatric/Behavioral: The patient is not nervous/anxious.      Physical Exam  Constitutional: No distress.  Obese   Eyes: Conjunctivae are normal.  Neck: Neck supple. No JVD present. No thyromegaly present.  Cardiovascular: Normal rate, regular rhythm and intact distal pulses.   Respiratory: Effort normal and breath sounds normal. No respiratory distress. He has no wheezes.  GI: Soft. Bowel sounds are normal. He exhibits no distension. There is no tenderness.  Musculoskeletal: He exhibits no edema.  Able to move all extremities   Lymphadenopathy:    He has no cervical adenopathy.  Neurological: He is alert.  Skin: Skin is warm and dry. He is not diaphoretic.  Lower extremities discolored   Psychiatric: He has a normal mood and affect.    ASSESSMENT/PLAN  1. CKD stage IV: his renal function is stable with k+ level 5.1 will continue kayexalate 90 gm weekly and will monitor his status.   2. Dyslipidemia: will continue lipitor 20 mgm daily   3. Constipation: will continue colace twice daily miralax daily and senna daily as needed  4. UI with BPH:  will continue flomax 0.4 mg daily; and myrbetriq 25 mg daily  5. Diabetes: will continue novolog 22 units with meals; NPH 22 units twice daily   6. RLS: will continue mirapex 0.125 mg daily  7. Anemia: will continue iron daily   Time spent with patient  45  minutes >50% time spent counseling; reviewing medical record; tests; labs; and developing future plan of care      Ok Edwards NP [facility]  Contact [phone] Monday through Friday 8am- 5pm  After hours call [phone]

## 2015-06-06 ENCOUNTER — Non-Acute Institutional Stay (SKILLED_NURSING_FACILITY): Payer: Medicare Other | Admitting: Adult Health

## 2015-06-06 ENCOUNTER — Encounter: Payer: Self-pay | Admitting: Adult Health

## 2015-06-06 DIAGNOSIS — G8929 Other chronic pain: Secondary | ICD-10-CM | POA: Diagnosis not present

## 2015-06-06 DIAGNOSIS — D638 Anemia in other chronic diseases classified elsewhere: Secondary | ICD-10-CM | POA: Diagnosis not present

## 2015-06-06 DIAGNOSIS — E1165 Type 2 diabetes mellitus with hyperglycemia: Secondary | ICD-10-CM | POA: Diagnosis not present

## 2015-06-06 DIAGNOSIS — Z794 Long term (current) use of insulin: Secondary | ICD-10-CM | POA: Diagnosis not present

## 2015-06-06 DIAGNOSIS — N184 Chronic kidney disease, stage 4 (severe): Secondary | ICD-10-CM

## 2015-06-06 DIAGNOSIS — E1122 Type 2 diabetes mellitus with diabetic chronic kidney disease: Secondary | ICD-10-CM | POA: Diagnosis not present

## 2015-06-06 DIAGNOSIS — E875 Hyperkalemia: Secondary | ICD-10-CM

## 2015-06-06 DIAGNOSIS — G2581 Restless legs syndrome: Secondary | ICD-10-CM | POA: Insufficient documentation

## 2015-06-06 DIAGNOSIS — IMO0002 Reserved for concepts with insufficient information to code with codable children: Secondary | ICD-10-CM

## 2015-06-06 MED ORDER — TRAMADOL HCL 50 MG PO TABS: 100.0000 mg | ORAL_TABLET | Freq: Four times a day (QID) | ORAL | Status: AC | PRN

## 2015-06-06 NOTE — Progress Notes (Signed)
Patient ID: Dale Henry, male   DOB: 09-22-1945, 70 y.o.   MRN: [id number]   Facility: [facility]       Allergies  Allergen Reactions  . Codeine Itching  . Shellfish Allergy Nausea Only    Chief Complaint  Patient presents with  . Discharge Note    discharge from facility    HPI:  He is being discharged to home with home health for pt/rn. He will not need dme. He has all necessary dme at home. He will need his prescriptions to be written and will need to follow up with his pcp.  He had been hospitalized for acute on chronic renal failure; cellulitis. He was admitted to this facility for short term rehab and is ready to return back home.    Past Medical History  Diagnosis Date  . Morbid obesity ([place])   . Diabetes mellitus without complication ([place])   . 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 Acquired lymphedema 02/25/2015  . 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 No pertinent past surgical history.  VITAL SIGNS BP 138/66 mmHg  Pulse 82  Temp(Src) 97.8 F (36.6 C) (Oral)  Resp 16  Ht 5\' 10"  (1.778 m)  Wt 396 lb 4 oz (179.738 kg)  BMI 56.86 kg/m2  SpO2 96%  Patient's Medications  New Prescriptions   No medications on file  Previous Medications   ATORVASTATIN (LIPITOR) 20 MG TABLET    Take 20 mg by mouth daily.   CETIRIZINE (ZYRTEC) 10 MG TABLET    Take 10 mg by mouth daily.   DOCUSATE SODIUM (COLACE) 100 MG CAPSULE    Take 100 mg by mouth 2 (two) times daily.   FEEDING SUPPLEMENT (BOOST HIGH PROTEIN) LIQD    Take 1 Container by mouth 2 (two) times daily between meals.   FERROUS SULFATE 325 (65 FE) MG TABLET    Take 325 mg by mouth daily with breakfast.   GABAPENTIN (NEURONTIN) 100 MG CAPSULE    Take 100 mg by mouth 2 (two) times daily.   HYDROCODONE-ACETAMINOPHEN (NORCO) 10-325 MG TABLET    Take 1 tablet by mouth every 4 (four) hours as needed.   HYDROXYZINE (ATARAX/VISTARIL) 25 MG TABLET    Take 25 mg by mouth 3 (three) times daily.    INSULIN ASPART (NOVOLOG) 100 UNIT/ML INJECTION    Inject 22 Units into the skin 3 (three) times daily before meals.   NPH    Inject 30 Units into the skin 2 (two) times daily before a meal. Per sliding scale   MAGNESIUM HYDROXIDE (MILK OF MAGNESIA) 400 MG/5ML SUSPENSION    Take 30 mLs by mouth daily as needed for mild constipation.   MIRABEGRON ER (MYRBETRIQ) 25 MG TB24 TABLET    Take 25 mg by mouth daily.   NYSTATIN (MYCOSTATIN/NYSTOP) 100000 UNIT/GM POWD    As directed per package insert 3 times daily to affected area   POLYETHYLENE GLYCOL (MIRALAX / GLYCOLAX) PACKET    Take 17 g by mouth daily.   PRAMIPEXOLE (MIRAPEX) 0.125 MG TABLET    Take 0.125 mg by mouth at bedtime.   PROBIOTIC CAPS    Take 250 mg tablet by mouth daily   PROMETHAZINE (PHENERGAN) 25 MG TABLET    Take 25 mg by mouth every 6 (six) hours as needed for nausea or vomiting.   SENNA (SENOKOT) 8.6 MG TABS TABLET    Take 1 tablet by mouth daily as needed for mild constipation.   SODIUM  POLYSTYRENE (KAYEXALATE) 15 GM/60ML SUSPENSION    Take 90 g by mouth once. Once every Monday   TAMSULOSIN (FLOMAX) 0.4 MG CAPS CAPSULE    Take 0.4 mg by mouth daily.   TRAMADOL (ULTRAM-ER) 100 MG 24 HR TABLET    Take 100 mg by mouth every 6 (six) hours as needed for pain.   VITAMIN C (ASCORBIC ACID) 500 MG TABLET    Take 500 mg by mouth 2 (two) times daily.  Modified Medications   No medications on file  Discontinued Medications   No medications on file     SIGNIFICANT DIAGNOSTIC EXAMS  04-04-15: Mild cardiomegaly and pulmonary vascular congestion. Retrocardiac consolidation and small LEFT pleural effusion. RIGHT lung base atelectasis  04-04-15: ct of head and cervical spine: No acute intracranial abnormalities. Chronic atrophy and small vessel ischemic changes.  Nonspecific straightening of usual cervical lordosis. Diffuse degenerative changes in the cervical spine. No acute displaced fractures identified.  04-04-15: abdominal ultrasound:  1. Limited exam due to patient body habitus. The spleen is not identified. 2. No acute findings in the abdomen identified   LABS REVIEWED:   04-04-15: wbc 6.0; hgb 8.5; hct 28.1; mcv 89.2; plt 189; glucose 299; bun 77; creat 5.39; k+ >7.5; na++139; ast 13; alt 14; total bili 0.2; albumin 2.6 04-06-15: glucose 159; bun 56; creat 4.25; k+ 5.3; na++144 04-08-15: glucose 264; bun 41; creat 2.94; k+ 5.2; na++140 04-16-15: glucose 432; bun 42; creat 2.77; k+ 4.9; na++135   04-28-15: glucose 223; bun 66; creat 2.81; k+ 5.9; na++137  05-04-15: glucose 167; bun 38; creat 2.16; k+ 5.8; na++140  05-18-15: glucose 91; bun 38; creat 1.88; k+ 5.1; na++144     Review of Systems  Constitutional: Negative for malaise/fatigue.  Respiratory: Negative for cough and shortness of breath.   Cardiovascular: Negative for chest pain, palpitations and leg swelling.  Gastrointestinal: Negative for heartburn, abdominal pain and constipation.  Musculoskeletal:. Negative for back pain and joint pain.  Skin: Negative.   Neurological: Negative for dizziness.  Psychiatric/Behavioral: The patient is not nervous/anxious.      Physical Exam  Constitutional: No distress.  Obese   Eyes: Conjunctivae are normal.  Neck: Neck supple. No JVD present. No thyromegaly present.  Cardiovascular: Normal rate, regular rhythm and intact distal pulses.   Respiratory: Effort normal and breath sounds normal. No respiratory distress. He has no wheezes.  GI: Soft. Bowel sounds are normal. He exhibits no distension. There is no tenderness.  Musculoskeletal: He exhibits no edema.  Able to move all extremities   Lymphadenopathy:    He has no cervical adenopathy.  Neurological: He is alert.  Skin: Skin is warm and dry. He is not diaphoretic.  Lower extremities discolored   Psychiatric: He has a normal mood and affect.    ASSESSMENT/PLAN   Will discharge him to home with home for pt/rn to evaluate and treat as indicated for gait;  balance and medication management. He will need require any dme; as he already has all necessary equipment. The facility is to setup his medical follow up for the next one to two week.  His prescriptions have been written for a 30 days supply of his medications with #30 ultram 50 mg tabs. He was not given a prescription for his percocet as he has not been taking this medication.    Time spent with patient  45  minutes >50% time spent counseling; reviewing medical record; tests; labs; and developing future plan of care  Ok Edwards NP [facility]  Contact [phone] Monday through Friday 8am- 5pm  After hours call [phone]

## 2017-07-20 DEATH — deceased

## 2018-01-10 IMAGING — CT CT HEAD W/O CM
4 of 6 series · 16 of 47 positions shown, 17 images · non-contrast
Comparison: None.

CLINICAL DATA: Fall 36 hours ago. New shortness of breath after the
fall.

EXAM:
CT HEAD WITHOUT CONTRAST
CT CERVICAL SPINE WITHOUT CONTRAST
TECHNIQUE: Multidetector CT imaging of the head and cervical spine was
performed following the standard protocol without intravenous
contrast. Multiplanar CT image reconstructions of the cervical spine
were also generated.

[Series 3: head without · axial · non-contrast · 0.43mm/px · z∈[-179,-9]mm · 3 of 35 slices shown, 4 images]
[im 1/35  brain]
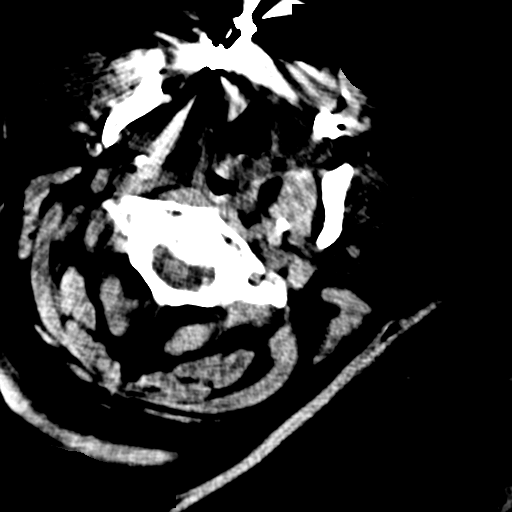
[im 1/35  bone]
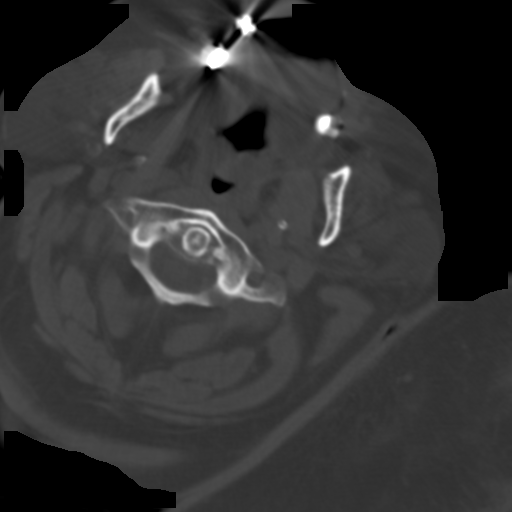
[im 18/35  brain]
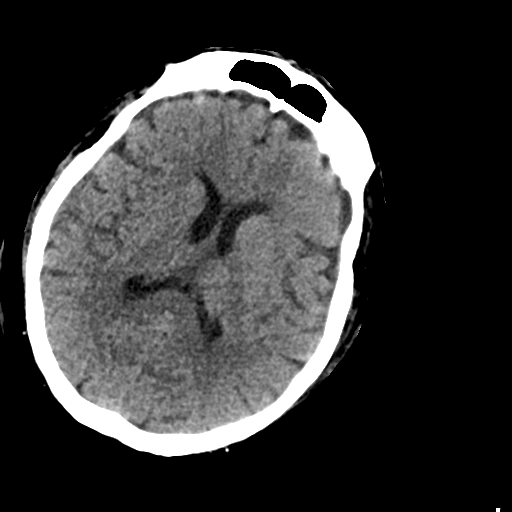
[im 35/35  brain]
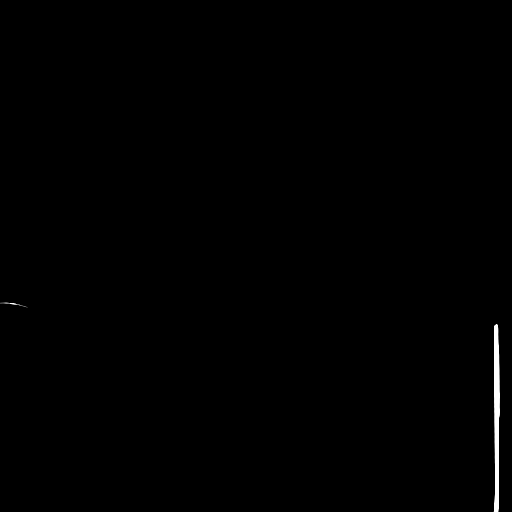

[Series 4: head bone · axial · 0.43mm/px · z∈[-159,-29]mm · 7 of 87 slices shown]
[im 11/87  bone]
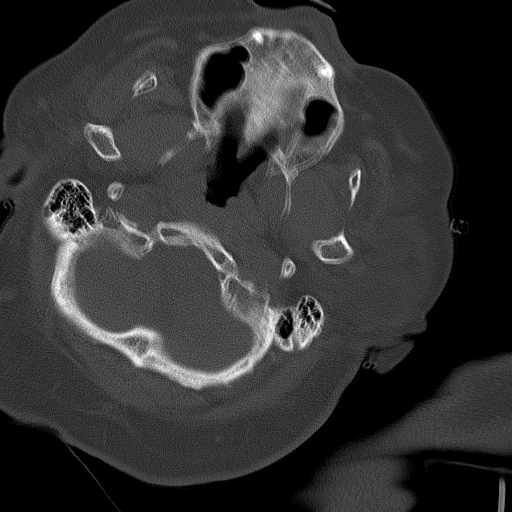
[im 22/87  bone]
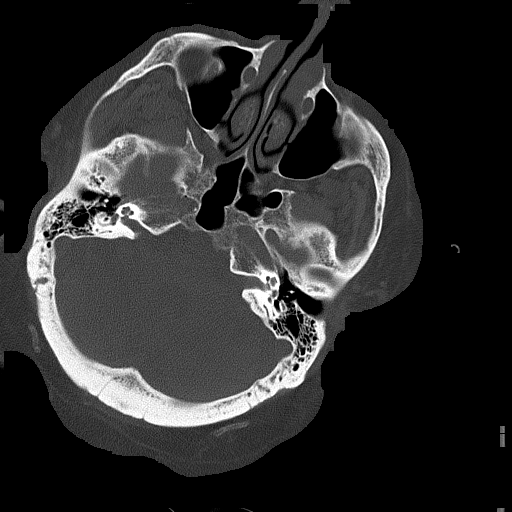
[im 33/87  bone]
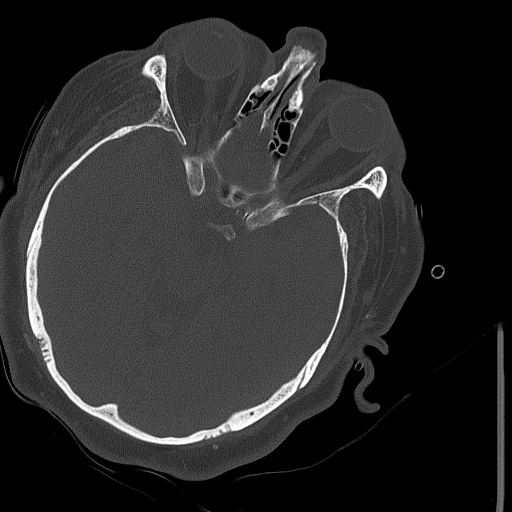
[im 44/87  bone]
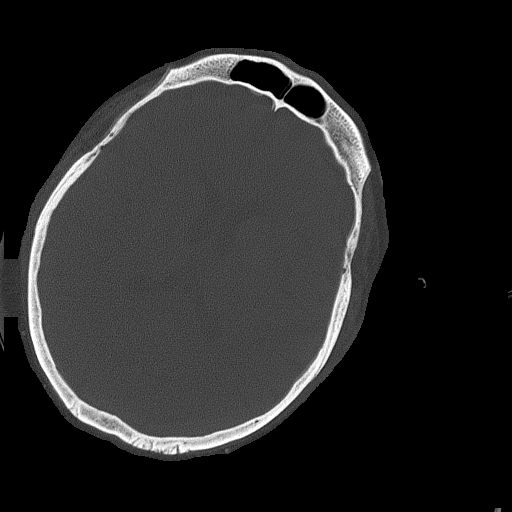
[im 54/87  bone]
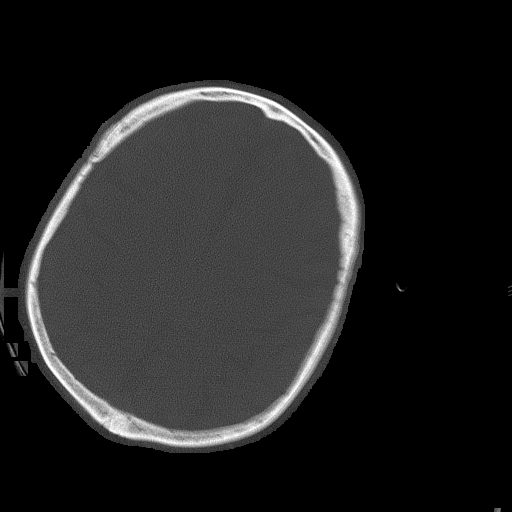
[im 65/87  bone]
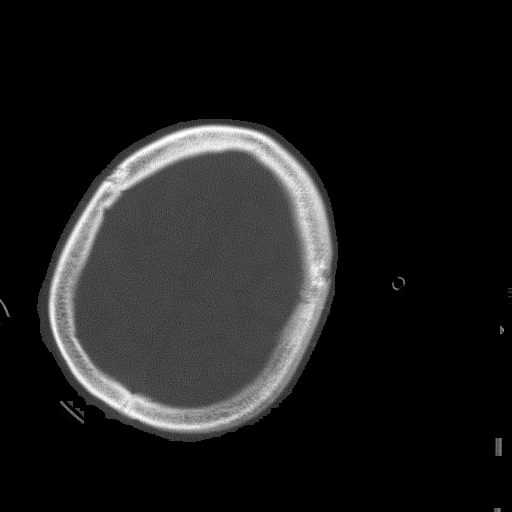
[im 76/87  bone]
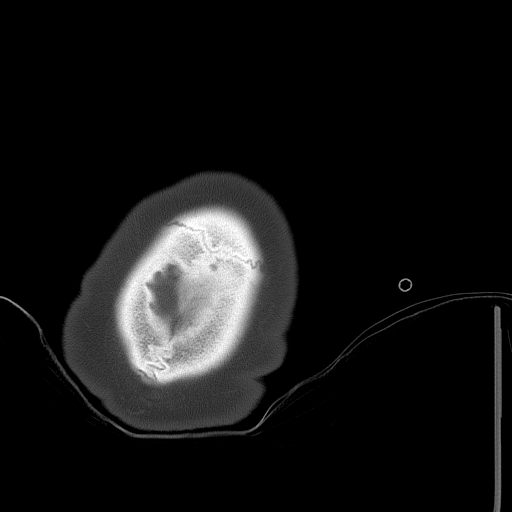

[Series 6: c_spine 2.0 sag bone · sagittal · 0.26mm/px · 3 of 57 slices shown]
[im 19/57  brain]
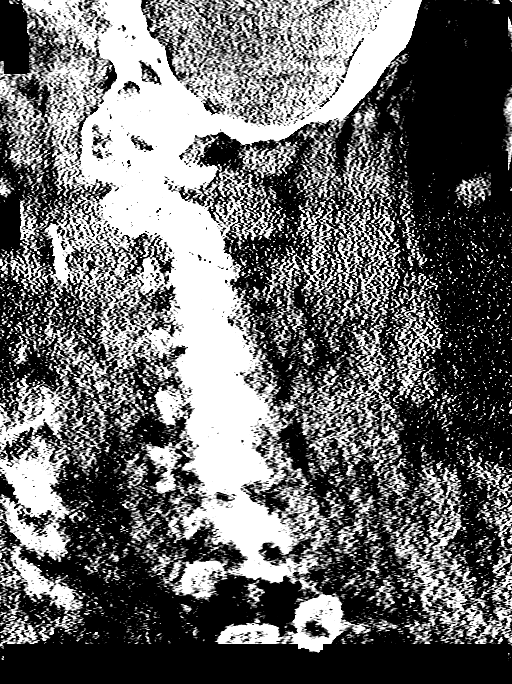
[im 29/57  brain]
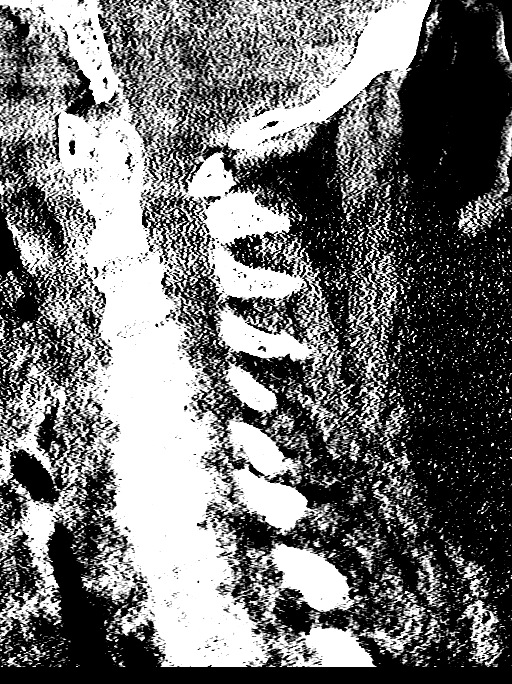
[im 38/57  brain]
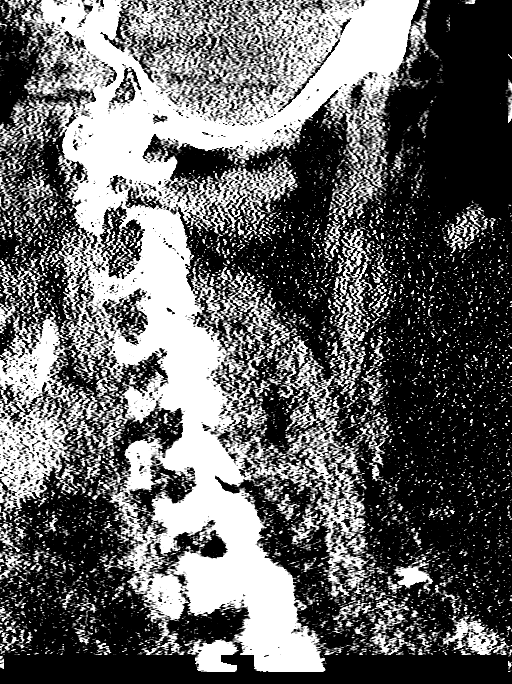

[Series 8: c_spine 2.0 cor bone · coronal · 0.26mm/px · 3 of 63 slices shown]
[im 21/63  brain]
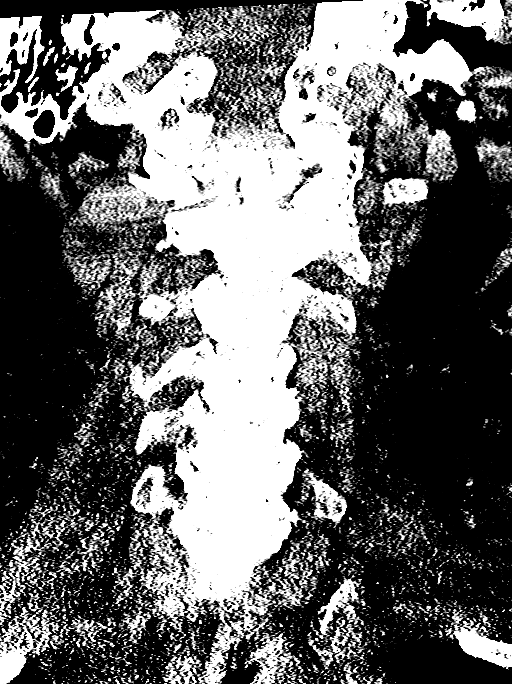
[im 28/63  brain]
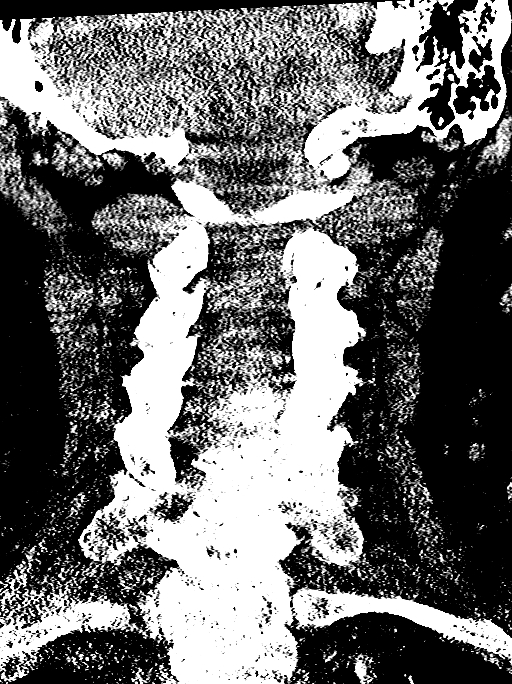
[im 35/63  brain]
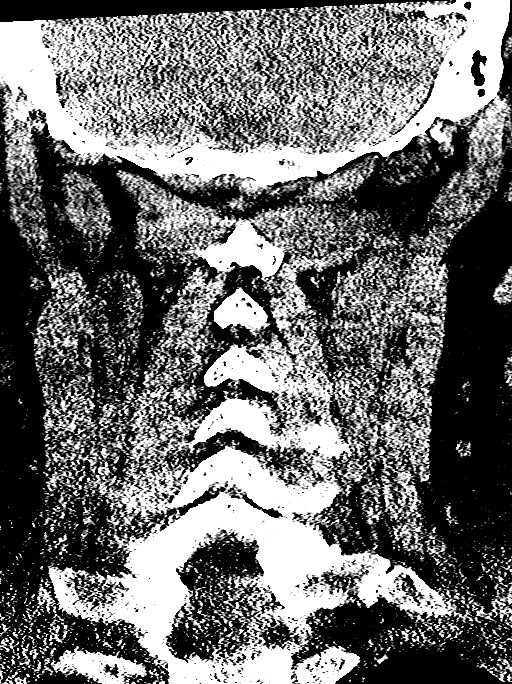

[16 of 47 positions shown; findings below may reference images not displayed]

FINDINGS: CT HEAD FINDINGS

Diffuse cerebral atrophy. No ventricular dilatation. Patchy
low-attenuation change in the deep white matter consistent with
small vessel ischemia. No mass effect or midline shift. No abnormal
extra-axial fluid collections. Gray-white matter junctions are
distinct. Basal cisterns are not effaced. No evidence of acute
intracranial hemorrhage. No depressed skull fractures. Mild mucosal
thickening in the paranasal sinuses. No acute air-fluid levels.
Mastoid air cells are not opacified. Vascular calcifications.

CT CERVICAL SPINE FINDINGS

Examination is technically limited due to motion artifact.
Straightening of the usual cervical lordosis. This appearance is
nonspecific and may be due to patient positioning or degenerative
change but ligamentous injury or muscle spasm could also have this
appearance inner not excluded. Diffuse degenerative changes
throughout the cervical spine with narrowed cervical interspaces and
associated endplate hypertrophic changes. Degenerative changes
throughout the cervical facet joints. No anterior subluxation. No
vertebral compression deformities. No prevertebral soft tissue
swelling. C1-2 articulation appears intact. Vascular calcifications.
IMPRESSION: No acute intracranial abnormalities. Chronic atrophy and small
vessel ischemic changes.

Nonspecific straightening of usual cervical lordosis. Diffuse
degenerative changes in the cervical spine. No acute displaced
fractures identified.
